# Patient Record
Sex: Female | Born: 1965 | Race: White | Hispanic: No | Marital: Married | Smoking: Never smoker
Health system: Southern US, Community
[De-identification: ages and names within clinical notes are randomized; demographics above are authoritative.]

## PROBLEM LIST (undated history)

## (undated) DIAGNOSIS — I10 Essential (primary) hypertension: Secondary | ICD-10-CM

## (undated) DIAGNOSIS — E785 Hyperlipidemia, unspecified: Secondary | ICD-10-CM

## (undated) DIAGNOSIS — C4492 Squamous cell carcinoma of skin, unspecified: Secondary | ICD-10-CM

## (undated) DIAGNOSIS — G43909 Migraine, unspecified, not intractable, without status migrainosus: Secondary | ICD-10-CM

## (undated) HISTORY — DX: Squamous cell carcinoma of skin, unspecified: C44.92

## (undated) HISTORY — DX: Essential (primary) hypertension: I10

## (undated) HISTORY — DX: Migraine, unspecified, not intractable, without status migrainosus: G43.909

## (undated) HISTORY — DX: Hyperlipidemia, unspecified: E78.5

---

## 1989-01-07 HISTORY — PX: TUBAL LIGATION: SHX77

## 2004-01-08 HISTORY — PX: CARPAL TUNNEL RELEASE: SHX101

## 2007-01-08 DIAGNOSIS — E785 Hyperlipidemia, unspecified: Secondary | ICD-10-CM

## 2007-01-08 DIAGNOSIS — I10 Essential (primary) hypertension: Secondary | ICD-10-CM

## 2007-01-08 HISTORY — DX: Essential (primary) hypertension: I10

## 2007-01-08 HISTORY — DX: Hyperlipidemia, unspecified: E78.5

## 2009-02-23 ENCOUNTER — Ambulatory Visit (HOSPITAL_COMMUNITY): Admission: RE | Admit: 2009-02-23 | Discharge: 2009-02-23 | Payer: Self-pay | Admitting: Family Medicine

## 2009-04-20 ENCOUNTER — Other Ambulatory Visit: Admission: RE | Admit: 2009-04-20 | Discharge: 2009-04-20 | Payer: Self-pay | Admitting: Family Medicine

## 2009-04-25 ENCOUNTER — Encounter: Admission: RE | Admit: 2009-04-25 | Discharge: 2009-04-25 | Payer: Self-pay | Admitting: Family Medicine

## 2009-06-15 ENCOUNTER — Ambulatory Visit (HOSPITAL_COMMUNITY): Admission: RE | Admit: 2009-06-15 | Discharge: 2009-06-15 | Payer: Self-pay | Admitting: Surgery

## 2010-03-26 LAB — CBC
Hemoglobin: 14 g/dL (ref 12.0–15.0)
MCHC: 35.2 g/dL (ref 30.0–36.0)
MCV: 92.6 fL (ref 78.0–100.0)
RBC: 4.29 MIL/uL (ref 3.87–5.11)
RDW: 13.2 % (ref 11.5–15.5)

## 2010-03-26 LAB — BASIC METABOLIC PANEL
Calcium: 9.9 mg/dL (ref 8.4–10.5)
Creatinine, Ser: 0.73 mg/dL (ref 0.4–1.2)
GFR calc Af Amer: >60 mL/min (ref >60)
Potassium: 4.8 mEq/L (ref 3.5–5.1)
Sodium: 138 mEq/L (ref 135–145)

## 2010-04-05 ENCOUNTER — Other Ambulatory Visit: Payer: Self-pay | Admitting: Family Medicine

## 2010-04-05 DIAGNOSIS — Z1231 Encounter for screening mammogram for malignant neoplasm of breast: Secondary | ICD-10-CM

## 2010-04-27 ENCOUNTER — Ambulatory Visit
Admission: RE | Admit: 2010-04-27 | Discharge: 2010-04-27 | Disposition: A | Discharge status: Home / Self Care | Payer: BC Managed Care – PPO | Source: Ambulatory Visit | Attending: Family Medicine | Admitting: Family Medicine

## 2010-04-27 DIAGNOSIS — Z1231 Encounter for screening mammogram for malignant neoplasm of breast: Secondary | ICD-10-CM

## 2010-09-24 ENCOUNTER — Other Ambulatory Visit: Payer: Self-pay | Admitting: Obstetrics and Gynecology

## 2010-09-24 ENCOUNTER — Other Ambulatory Visit (HOSPITAL_COMMUNITY)
Admission: RE | Admit: 2010-09-24 | Discharge: 2010-09-24 | Disposition: A | Discharge status: Home / Self Care | Payer: BC Managed Care – PPO | Source: Ambulatory Visit | Attending: Obstetrics and Gynecology | Admitting: Obstetrics and Gynecology

## 2010-09-24 DIAGNOSIS — Z01419 Encounter for gynecological examination (general) (routine) without abnormal findings: Secondary | ICD-10-CM | POA: Insufficient documentation

## 2011-03-22 ENCOUNTER — Other Ambulatory Visit: Payer: Self-pay | Admitting: Family Medicine

## 2011-03-22 DIAGNOSIS — Z1231 Encounter for screening mammogram for malignant neoplasm of breast: Secondary | ICD-10-CM

## 2011-04-29 ENCOUNTER — Ambulatory Visit
Admission: RE | Admit: 2011-04-29 | Discharge: 2011-04-29 | Disposition: A | Discharge status: Home / Self Care | Payer: BC Managed Care – PPO | Source: Ambulatory Visit | Attending: Family Medicine | Admitting: Family Medicine

## 2011-04-29 DIAGNOSIS — Z1231 Encounter for screening mammogram for malignant neoplasm of breast: Secondary | ICD-10-CM

## 2011-09-25 ENCOUNTER — Other Ambulatory Visit: Payer: Self-pay | Admitting: Obstetrics and Gynecology

## 2011-09-25 ENCOUNTER — Other Ambulatory Visit (HOSPITAL_COMMUNITY)
Admission: RE | Admit: 2011-09-25 | Discharge: 2011-09-25 | Disposition: A | Discharge status: Home / Self Care | Payer: BC Managed Care – PPO | Source: Ambulatory Visit | Attending: Obstetrics and Gynecology | Admitting: Obstetrics and Gynecology

## 2011-09-25 DIAGNOSIS — Z01419 Encounter for gynecological examination (general) (routine) without abnormal findings: Secondary | ICD-10-CM | POA: Insufficient documentation

## 2011-09-25 DIAGNOSIS — Z1151 Encounter for screening for human papillomavirus (HPV): Secondary | ICD-10-CM | POA: Insufficient documentation

## 2011-12-03 ENCOUNTER — Encounter: Payer: Self-pay | Admitting: Internal Medicine

## 2012-03-02 ENCOUNTER — Ambulatory Visit (INDEPENDENT_AMBULATORY_CARE_PROVIDER_SITE_OTHER): Payer: BC Managed Care – PPO | Admitting: Internal Medicine

## 2012-03-02 ENCOUNTER — Encounter: Payer: Self-pay | Admitting: Internal Medicine

## 2012-03-02 VITALS — BP 118/74 | HR 66 | Temp 98.1°F | Wt 175.0 lb

## 2012-03-02 DIAGNOSIS — I10 Essential (primary) hypertension: Secondary | ICD-10-CM | POA: Insufficient documentation

## 2012-03-02 DIAGNOSIS — E785 Hyperlipidemia, unspecified: Secondary | ICD-10-CM

## 2012-03-02 NOTE — Assessment & Plan Note (Signed)
Well controlled without apparent side effects from the Toprol. Check labs, will RF as needed

## 2012-03-02 NOTE — Progress Notes (Signed)
  Subjective:    Patient ID: Cindy Mooney, female    DOB: 1965/02/24, 47 y.o.   MRN: [id number]  HPI New pt, here to get stablished, used to be seen at [facility] . Hypertension, good medication compliance, no apparent side effects. Hyperlipidemia, good medication compliance, last labs about 9 months ago. In general feeling well, has a very healthy lifestyle  Past Medical History  Diagnosis Date  . HTN (hypertension) 2009  . Hyperlipidemia 2009  . Migraines     weather related    Past Surgical History  Procedure Laterality Date  . Cesarean section      x1  . Carpal tunnel release Bilateral 2006  . Btl     History   Social History  . Marital Status: Married    Spouse Name: N/A    Number of Children: 2  . 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 Smoking status: Never Smoker   . Smokeless tobacco: Never Used  . Alcohol Use: Yes     Comment: socially   . Drug Use: No  . Sexually Active: Not on file   Other Topics Concern  . Not on file   Social History Narrative   Exercises regulalrly   Family History  Problem Relation Age of Onset  . Colon cancer Neg Hx   . Breast cancer      aunt , dx in her 20  . Prostate cancer Father   . CAD Mother     M had a MI , first in her mid 6s, heavy ETOH-Tobacco  . Diabetes Neg Hx   . Hyperlipidemia Mother   . Hypertension Mother     Review of Systems No chest pain, shortness or breath. No nausea, vomiting, diarrhea.     Objective:   Physical Exam General -- alert, well-developed    Neck --no thyromegaly  Lungs -- normal respiratory effort, no intercostal retractions, no accessory muscle use, and normal breath sounds.   Heart-- normal rate, regular rhythm, no murmur, and no gallop.   Neurologic-- alert & oriented X3 and strength normal in all extremities. Psych-- Cognition and judgment appear intact. Alert and cooperative with normal attention span and  concentration.  not anxious appearing and not depressed appearing.       Assessment & Plan:   Mammograms at the breast center of [facility]. Gynecologist, Dr. Dion Body  at [facility]

## 2012-03-02 NOTE — Assessment & Plan Note (Signed)
Good compliance of medication, no apparent side effects, has a healthy lifestyle. We'll get labs. She sees a cardiologist, Dr. Fayrene Fearing McGukin in [place]

## 2012-03-02 NOTE — Patient Instructions (Signed)
Please come back fasting: CMP, CBC--- dx HTN  FLP--- dx  hyperlipidemia

## 2012-03-04 ENCOUNTER — Other Ambulatory Visit (INDEPENDENT_AMBULATORY_CARE_PROVIDER_SITE_OTHER): Payer: BC Managed Care – PPO

## 2012-03-04 DIAGNOSIS — I10 Essential (primary) hypertension: Secondary | ICD-10-CM

## 2012-03-04 DIAGNOSIS — E785 Hyperlipidemia, unspecified: Secondary | ICD-10-CM

## 2012-03-04 LAB — LIPID PANEL
HDL: 75 mg/dL (ref >39.00)
LDL Cholesterol: 57 mg/dL (ref 0–99)
Total CHOL/HDL Ratio: 2
VLDL: 9.6 mg/dL (ref 0.0–40.0)

## 2012-03-04 LAB — CBC WITH DIFFERENTIAL/PLATELET
Basophils Relative: 1.3 % (ref 0.0–3.0)
Eosinophils Relative: 0.5 % (ref 0.0–5.0)
Lymphocytes Relative: 38.8 % (ref 12.0–46.0)
Lymphs Abs: 2 10*3/uL (ref 0.7–4.0)
MCV: 91.7 fl (ref 78.0–100.0)
Neutro Abs: 2.7 10*3/uL (ref 1.4–7.7)
Neutrophils Relative %: 51.5 % (ref 43.0–77.0)
Platelets: 263 10*3/uL (ref 150.0–400.0)
RBC: 4.24 Mil/uL (ref 3.87–5.11)
WBC: 5.2 10*3/uL (ref 4.5–10.5)

## 2012-03-04 LAB — COMPREHENSIVE METABOLIC PANEL
AST: 21 U/L (ref 0–37)
Albumin: 3.6 g/dL (ref 3.5–5.2)
Alkaline Phosphatase: 73 U/L (ref 39–117)
BUN: 8 mg/dL (ref 6–23)
Calcium: 9 mg/dL (ref 8.4–10.5)
Chloride: 104 mEq/L (ref 96–112)
Creatinine, Ser: 0.7 mg/dL (ref 0.4–1.2)
GFR: 88.77 mL/min (ref >60.00)
Total Protein: 6.2 g/dL (ref 6.0–8.3)

## 2012-03-06 ENCOUNTER — Encounter: Payer: Self-pay | Admitting: *Deleted

## 2012-04-02 ENCOUNTER — Other Ambulatory Visit: Payer: Self-pay

## 2012-04-02 DIAGNOSIS — Z1231 Encounter for screening mammogram for malignant neoplasm of breast: Secondary | ICD-10-CM

## 2012-04-30 ENCOUNTER — Ambulatory Visit
Admission: RE | Admit: 2012-04-30 | Discharge: 2012-04-30 | Disposition: A | Discharge status: Home / Self Care | Payer: BC Managed Care – PPO | Source: Ambulatory Visit

## 2012-04-30 DIAGNOSIS — Z1231 Encounter for screening mammogram for malignant neoplasm of breast: Secondary | ICD-10-CM

## 2012-06-08 ENCOUNTER — Telehealth: Payer: Self-pay | Admitting: General Practice

## 2012-06-08 NOTE — Telephone Encounter (Signed)
Pt called stating that her cardiologist has advised pt to have her PCP fill her Metoprolol. Pt was last seen on 03-02-2012 by PAZ. Please advise if ok. Pt uses [facility].

## 2012-06-09 MED ORDER — METOPROLOL TARTRATE 50 MG PO TABS: 50.0000 mg | ORAL_TABLET | Freq: Every day | ORAL | Status: DC

## 2012-06-09 NOTE — Telephone Encounter (Signed)
Med filled.  

## 2012-06-09 NOTE — Telephone Encounter (Signed)
Ok RF x 1 year 

## 2012-06-12 ENCOUNTER — Telehealth: Payer: Self-pay | Admitting: General Practice

## 2012-06-12 MED ORDER — METOPROLOL SUCCINATE ER 50 MG PO TB24: 50.0000 mg | ORAL_TABLET | Freq: Every day | ORAL | Status: DC

## 2012-06-12 NOTE — Telephone Encounter (Signed)
Rx faxed to pharmacy with copy of fax pharmacy sent.

## 2012-06-12 NOTE — Telephone Encounter (Signed)
She needs  to stay on the same medication, I enter the right prescription, it is ready to be send

## 2012-06-12 NOTE — Telephone Encounter (Signed)
Pt called stating that our office needed to contact [facility] in regards to her BP medications. Called [phone] and spoke with insurance. They state pt has been on metoprolol succinate ER 50mg  since 02-26-2011 and wanted to verify that we did not want pt on same medication. If a change is necessary please type in comments "clarify for previous Rx # [id number]"

## 2012-07-31 ENCOUNTER — Ambulatory Visit (INDEPENDENT_AMBULATORY_CARE_PROVIDER_SITE_OTHER): Payer: BC Managed Care – PPO | Admitting: Internal Medicine

## 2012-07-31 VITALS — BP 130/88 | HR 64 | Temp 98.2°F | Wt 174.8 lb

## 2012-07-31 DIAGNOSIS — R079 Chest pain, unspecified: Secondary | ICD-10-CM

## 2012-07-31 LAB — D-DIMER, QUANTITATIVE: D-Dimer, Quant: 0.27 ug/mL-FEU (ref 0.00–0.48)

## 2012-07-31 LAB — BASIC METABOLIC PANEL WITH GFR
Creat: 0.69 mg/dL (ref 0.50–1.10)
Glucose, Bld: 82 mg/dL (ref 70–99)
Potassium: 4.5 mEq/L (ref 3.5–5.3)
Sodium: 136 mEq/L (ref 135–145)

## 2012-07-31 NOTE — Assessment & Plan Note (Addendum)
Chest pain, atypical for ACS, She did take a prolonged car trip recently. Patient reports she had a completely normal stress test 01-2012 by her cardiologist. EKG today NSR Plan: Motrin as needed D-dimer, BMP ER if symptoms severe (even if the d-dimer is negative, she may need a CT)

## 2012-07-31 NOTE — Patient Instructions (Addendum)
Motrin 200 mg 2 tablets every 6 hours as needed for pain. Always take it with food because may cause gastritis and ulcers. If you notice nausea, stomach pain, change in the color of stools --->  Stop the medicine and let us know You are not improving in the next few days please call. If you have severe symptoms either call or go to the ER.

## 2012-07-31 NOTE — Progress Notes (Signed)
  Subjective:    Patient ID: Cindy Mooney, female    DOB: 1965-09-30, 47 y.o.   MRN: [id number]  HPI Acute visit 3 days history of on and off chest pain, located anteriorly, on and off, described as ache, Some radiation to the back, intensity is 6/10; now is mild, constant dull ache worse with certain torso movements. She did drive to [facility] on [phone] and came back 7-20  Past Medical History  Diagnosis Date  . HTN (hypertension) 2009  . Hyperlipidemia 2009  . Migraines     weather related    Past Surgical History  Procedure Laterality Date  . Cesarean section      x1  . Carpal tunnel release Bilateral 2006  . Btl     History   Social History  . Marital Status: Married    Spouse Name: N/A    Number of Children: 2  . 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 Smoking status: Never Smoker   . Smokeless tobacco: Never Used  . Alcohol Use: Yes     Comment: socially   . Drug Use: No  . Sexually Active: Not on file   Other Topics Concern  . Not on file   Social History Narrative   Exercises regulalrly    Review of Systems No fever or chills. No cough or shortness or breath Denies any leg swelling or pain. She went to [facility] on vacation, admits she drank slightly more than usual. Also reports ++ stress at work. Denies classic heartburn.     Objective:   Physical Exam  Abdominal:     BP 130/88  Pulse 64  Temp(Src) 98.2 F (36.8 C) (Oral)  Wt 174 lb 12.8 oz (79.289 kg)  SpO2 95%  General -- alert, well-developed, NAD .    Lungs -- normal respiratory effort, no intercostal retractions, no accessory muscle use, and normal breath sounds.   Heart-- normal rate, regular rhythm, no murmur, and no gallop.   Abdomen--soft, non-tender, no distention, no masses .   Extremities-- no pretibial edema bilaterally; calves symmetric and not tender.  Neurologic-- alert & oriented X3 and strength  normal in all extremities. Psych-- Cognition and judgment appear intact. Alert and cooperative with normal attention span and concentration.  not anxious appearing and not depressed appearing.        Assessment & Plan:

## 2012-08-02 ENCOUNTER — Encounter: Payer: Self-pay | Admitting: Internal Medicine

## 2012-08-31 ENCOUNTER — Ambulatory Visit (INDEPENDENT_AMBULATORY_CARE_PROVIDER_SITE_OTHER): Payer: BC Managed Care – PPO | Admitting: Nurse Practitioner

## 2012-08-31 ENCOUNTER — Encounter: Payer: Self-pay | Admitting: Nurse Practitioner

## 2012-08-31 VITALS — BP 134/88 | HR 55 | Temp 97.9°F | Wt 175.8 lb

## 2012-08-31 DIAGNOSIS — E785 Hyperlipidemia, unspecified: Secondary | ICD-10-CM

## 2012-08-31 DIAGNOSIS — Z130 Encounter for screening for diseases of the blood and blood-forming organs and certain disorders involving the immune mechanism: Secondary | ICD-10-CM

## 2012-08-31 DIAGNOSIS — Z1321 Encounter for screening for nutritional disorder: Secondary | ICD-10-CM

## 2012-08-31 DIAGNOSIS — I10 Essential (primary) hypertension: Secondary | ICD-10-CM

## 2012-08-31 LAB — LIPID PANEL
Cholesterol: 149 mg/dL (ref 0–200)
HDL: 72.7 mg/dL (ref >39.00)
LDL Cholesterol: 53 mg/dL (ref 0–99)
Total CHOL/HDL Ratio: 2
Triglycerides: 118 mg/dL (ref 0.0–149.0)
VLDL: 23.6 mg/dL (ref 0.0–40.0)

## 2012-08-31 LAB — ALT: ALT: 24 U/L (ref 0–35)

## 2012-08-31 NOTE — Progress Notes (Signed)
Subjective:    Cindy Mooney is here for follow up of elevated cholesterol. Compliance with treatment has been good. She has not been on Paleo diet this summer, and has consumed more sugar than usual. The patient exercises frequently. Patient denies muscle pain associated with her medications.  The following portions of the patient's history were reviewed and updated as appropriate: allergies, current medications, past family history, past medical history and problem list.  Review of Systems Constitutional: negative for anorexia, fatigue, fevers, night sweats and weight loss Respiratory: negative for cough, pleurisy/chest pain and sputum Cardiovascular: negative for chest pain, chest pressure/discomfort, irregular heart beat and lower extremity edema Gastrointestinal: negative for abdominal pain, change in bowel habits and nausea    Objective:    BP 134/88  Pulse 55  Temp(Src) 97.9 F (36.6 C) (Oral)  Wt 175 lb 12.8 oz (79.742 kg)  SpO2 98%  LMP 08/17/2012 General appearance: alert, cooperative, appears stated age and no distress Head: Normocephalic, without obvious abnormality, atraumatic Eyes: negative findings: lids and lashes normal, conjunctivae and sclerae normal and corneas clear Lungs: clear to auscultation bilaterally Heart: regular rate and rhythm, S1, S2 normal, no murmur, click, rub or gallop Extremities: extremities normal, atraumatic, no cyanosis or edema Pulses: 2+ and symmetric  Lab Review Lab Results  Component Value Date   CHOL 149 08/31/2012   CHOL 142 03/04/2012   HDL 72.70 08/31/2012   HDL 65.78 03/04/2012      Assessment:    Dyslipidemia under excellent control, current meds lipitor 40 mg daily.   Vitamin D screen Plan:    1. Continue dietary measures ( decrease/eliminate processed flour & sugar), Continue regular exercise. Consider decreasing daily dose to 20 mg.  2 Vit d level today. Follow up in 6 months.

## 2012-08-31 NOTE — Patient Instructions (Signed)
This office will call you with lab results. Your medicines will be filled once labs are back. If you continue on statin medicines, I recommend you take CoQ10 supplement- 1T daily. Take blood pressure at home. If it is consistently over 140/90, please let Dr. Drue Novel know, as he may want to adjust your blood pressure medicine. Pleasure to meet you!

## 2012-09-22 ENCOUNTER — Encounter: Payer: Self-pay | Admitting: Physician Assistant

## 2012-09-22 ENCOUNTER — Ambulatory Visit (INDEPENDENT_AMBULATORY_CARE_PROVIDER_SITE_OTHER): Payer: BC Managed Care – PPO | Admitting: Physician Assistant

## 2012-09-22 VITALS — BP 118/88 | HR 72 | Temp 98.9°F | Resp 12 | Wt 179.0 lb

## 2012-09-22 DIAGNOSIS — R42 Dizziness and giddiness: Secondary | ICD-10-CM

## 2012-09-22 MED ORDER — MECLIZINE HCL 50 MG PO TABS: 25.0000 mg | ORAL_TABLET | Freq: Three times a day (TID) | ORAL | Status: DC | PRN

## 2012-09-22 NOTE — Progress Notes (Signed)
Patient ID: Cindy Mooney, female   DOB: 1965/06/02, 47 y.o.   MRN: [id number]  Patient presents to clinic today c/o intermittent dizziness over the past 3 days.  Patient also endorses sensation of clogged R ear.  Patient denies history of vertigo, menieres disease.  Denies nausea, vomiting.  Dizziness is worse when she turns her head quickly.  Denies lightheadedness or syncope.  Denies chest pain or heart palpitations.  Does endorse nasal congestion and pressure 2 days ago that has since resolved.  Denies ear pain, drainage, or change in hearing.  Past Medical History  Diagnosis Date  . HTN (hypertension) 2009  . Hyperlipidemia 2009  . Migraines     weather related     Current Outpatient Prescriptions on File Prior to Visit  Medication Sig Dispense Refill  . atorvastatin (LIPITOR) 80 MG tablet Take 40 mg by mouth daily.      . metoprolol succinate (TOPROL-XL) 50 MG 24 hr tablet Take 1 tablet (50 mg total) by mouth daily. Take with or immediately following a meal.  90 tablet  3   No current facility-administered medications on file prior to visit.    Allergies  Allergen Reactions  . Tetracyclines & Related     Hives and nausea    Family History  Problem Relation Age of Onset  . Colon cancer Neg Hx   . Breast cancer      aunt , dx in her 63  . Prostate cancer Father   . CAD Mother     M had a MI , first in her mid 77s, heavy ETOH-Tobacco  . Diabetes Neg Hx   . Hyperlipidemia Mother   . Hypertension Mother     History   Social History  . Marital Status: Married    Spouse Name: N/A    Number of Children: 2  . 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 Smoking status: Never Smoker   . Smokeless tobacco: Never Used  . Alcohol Use: Yes     Comment: socially   . Drug Use: No  . Sexual Activity: None   Other Topics Concern  . None   Social History Narrative   Exercises regulalrly   Review of Systems   Constitutional: Negative for fever, chills, weight loss and malaise/fatigue.  HENT: Positive for congestion. Negative for hearing loss, ear pain, sore throat, tinnitus and ear discharge.   Respiratory: Negative for cough.   Cardiovascular: Negative for chest pain and palpitations.  Neurological: Positive for dizziness. Negative for tingling, sensory change, focal weakness, seizures, loss of consciousness and headaches.  Endo/Heme/Allergies: Positive for environmental allergies.   Filed Vitals:   09/22/12 1534  BP: 118/88  Pulse: 72  Temp:   Resp:    Physical Exam  Vitals reviewed. Constitutional: She is oriented to person, place, and time and well-developed, well-nourished, and in no distress.  HENT:  Head: Normocephalic and atraumatic.  Right Ear: External ear normal.  Left Ear: External ear normal.  Nose: Nose normal.  Mouth/Throat: Oropharynx is clear and moist. No oropharyngeal exudate.  Eyes: Conjunctivae are normal. Pupils are equal, round, and reactive to light.  No nystagmus noted on examination  Neck: Neck supple.  Cardiovascular: Normal rate, regular rhythm, normal heart sounds and intact distal pulses.   Pulmonary/Chest: Effort normal and breath sounds normal.  Lymphadenopathy:    She has no cervical adenopathy.  Neurological: She is alert and  oriented to person, place, and time. No cranial nerve deficit.  Skin: Skin is warm and dry. No rash noted.    Recent Results (from the past 2160 hour(s))  BASIC METABOLIC PANEL WITH GFR     Status: None   Collection Time    07/31/12 12:36 PM      Result Value Range   Sodium 136  135 - 145 mEq/L   Potassium 4.5  3.5 - 5.3 mEq/L   Chloride 102  96 - 112 mEq/L   CO2 25  19 - 32 mEq/L   Glucose, Bld 82  70 - 99 mg/dL   BUN 18  6 - 23 mg/dL   Creat 0.86  5.78 - 4.69 mg/dL   Calcium 9.1  8.4 - 62.9 mg/dL   GFR, Est African American >89     GFR, Est Non African American >89     Comment:       The estimated GFR is a  calculation valid for adults (>=32 years old)     that uses the CKD-EPI algorithm to adjust for age and sex. It is       not to be used for children, pregnant women, hospitalized patients,        patients on dialysis, or with rapidly changing kidney function.     According to the NKDEP, eGFR >89 is normal, 60-89 shows mild     impairment, 30-59 shows moderate impairment, 15-29 shows severe     impairment and <15 is ESRD.        D-DIMER, QUANTITATIVE     Status: None   Collection Time    07/31/12 12:36 PM      Result Value Range   D-Dimer, Quant 0.27  0.00 - 0.48 ug/mL-FEU   Comment: At the inhouse established cutoff value of 0.48 ug/mL FEU, this     methology has been documented in the literature to have a sensitivity     and negative predictive value of at least 98-99%.  The test result     should be correlated with an assessment of the clinical probability of     DVT/VTE.  ALT     Status: None   Collection Time    08/31/12  9:14 AM      Result Value Range   ALT 24  0 - 35 U/L  AST     Status: None   Collection Time    08/31/12  9:14 AM      Result Value Range   AST 25  0 - 37 U/L  LIPID PANEL     Status: None   Collection Time    08/31/12  9:14 AM      Result Value Range   Cholesterol 149  0 - 200 mg/dL   Comment: ATP III Classification       Desirable:  < 200 mg/dL               Borderline High:  200 - 239 mg/dL          High:  > = 528 mg/dL   Triglycerides 413.2  0.0 - 149.0 mg/dL   Comment: Normal:  <440 mg/dLBorderline High:  150 - 199 mg/dL   HDL 10.27  >25.36 mg/dL   VLDL 64.4  0.0 - 03.4 mg/dL   LDL Cholesterol 53  0 - 99 mg/dL   Total CHOL/HDL Ratio 2     Comment:  Men          Women1/2 Average Risk     3.4          3.3Average Risk          5.0          4.42X Average Risk          9.6          7.13X Average Risk          15.0          11.0                      VITAMIN D 25 HYDROXY     Status: None   Collection Time    08/31/12  9:14 AM      Result  Value Range   Vit D, 25-Hydroxy 55  30 - 89 ng/mL   Comment: This assay accurately quantifies Vitamin D, which is the sum of the     25-Hydroxy forms of Vitamin D2 and D3.  Studies have shown that the     optimum concentration of 25-Hydroxy Vitamin D is 30 ng/mL or higher.      Concentrations of Vitamin D between 20 and 29 ng/mL are considered to     be insufficient and concentrations less than 20 ng/mL are considered     to be deficient for Vitamin D.    Assessment/Plan: No problem-specific assessment & plan notes found for this encounter.

## 2012-09-22 NOTE — Patient Instructions (Signed)
Please take a daily antihistamine -- Claritin or Zyrtec.  Take Meclizine as directed for dizziness.  Monitor salt intake.  Drink adequate fluids throughout the day.  Follow-up in 1-2 weeks.  If symptoms persist we may need a referral to ENT for further evaluation.  Dizziness Dizziness is a common problem. It is a feeling of unsteadiness or lightheadedness. You may feel like you are about to faint. Dizziness can lead to injury if you stumble or fall. A person of any age group can suffer from dizziness, but dizziness is more common in older adults. CAUSES  Dizziness can be caused by many different things, including:  Middle ear problems.  Standing for too long.  Infections.  An allergic reaction.  Aging.  An emotional response to something, such as the sight of blood.  Side effects of medicines.  Fatigue.  Problems with circulation or blood pressure.  Excess use of alcohol, medicines, or illegal drug use.  Breathing too fast (hyperventilation).  An arrhythmia or problems with your heart rhythm.  Low red blood cell count (anemia).  Pregnancy.  Vomiting, diarrhea, fever, or other illnesses that cause dehydration.  Diseases or conditions such as Parkinson's disease, high blood pressure (hypertension), diabetes, and thyroid problems.  Exposure to extreme heat. DIAGNOSIS  To find the cause of your dizziness, your caregiver may do a physical exam, lab tests, radiologic imaging scans, or an electrocardiography test (ECG).  TREATMENT  Treatment of dizziness depends on the cause of your symptoms and can vary greatly. HOME CARE INSTRUCTIONS   Drink enough fluids to keep your urine clear or pale yellow. This is especially important in very hot weather. In the elderly, it is also important in cold weather.  If your dizziness is caused by medicines, take them exactly as directed. When taking blood pressure medicines, it is especially important to get up slowly.  Rise slowly from  chairs and steady yourself until you feel okay.  In the morning, first sit up on the side of the bed. When this seems okay, stand slowly while holding onto something until you know your balance is fine.  If you need to stand in one place for a long time, be sure to move your legs often. Tighten and relax the muscles in your legs while standing.  If dizziness continues to be a problem, have someone stay with you for a day or two. Do this until you feel you are well enough to stay alone. Have the person call your caregiver if he or she notices changes in you that are concerning.  Do not drive or use heavy machinery if you feel dizzy.  Do not drink alcohol. SEEK IMMEDIATE MEDICAL CARE IF:   Your dizziness or lightheadedness gets worse.  You feel nauseous or vomit.  You develop problems with talking, walking, weakness, or using your arms, hands, or legs.  You are not thinking clearly or you have difficulty forming sentences. It may take a friend or family member to determine if your thinking is normal.  You develop chest pain, abdominal pain, shortness of breath, or sweating.  Your vision changes.  You notice any bleeding.  You have side effects from medicine that seems to be getting worse rather than better. MAKE SURE YOU:   Understand these instructions.  Will watch your condition.  Will get help right away if you are not doing well or get worse. Document Released: 06/19/2000 Document Revised: 03/18/2011 Document Reviewed: 07/13/2010 [facility] Patient Information 2014 [place], [place].

## 2012-09-22 NOTE — Assessment & Plan Note (Signed)
Rx Meclizine. Daily zyrtec.  Stay well hydrated.  Monitor salt intake.  If symptoms persist, return to clinic.

## 2012-09-24 ENCOUNTER — Telehealth: Payer: Self-pay | Admitting: Internal Medicine

## 2012-09-24 ENCOUNTER — Ambulatory Visit (HOSPITAL_BASED_OUTPATIENT_CLINIC_OR_DEPARTMENT_OTHER)
Admission: RE | Admit: 2012-09-24 | Discharge: 2012-09-24 | Disposition: A | Discharge status: Home / Self Care | Payer: BC Managed Care – PPO | Source: Ambulatory Visit | Attending: Physician Assistant | Admitting: Physician Assistant

## 2012-09-24 ENCOUNTER — Encounter: Payer: Self-pay | Admitting: Physician Assistant

## 2012-09-24 ENCOUNTER — Ambulatory Visit (INDEPENDENT_AMBULATORY_CARE_PROVIDER_SITE_OTHER): Payer: BC Managed Care – PPO | Admitting: Physician Assistant

## 2012-09-24 VITALS — BP 112/86 | HR 64 | Temp 97.7°F | Resp 16 | Ht 64.75 in | Wt 177.5 lb

## 2012-09-24 DIAGNOSIS — R42 Dizziness and giddiness: Secondary | ICD-10-CM

## 2012-09-24 MED ORDER — PROMETHAZINE HCL 12.5 MG PO TABS: 12.5000 mg | ORAL_TABLET | Freq: Four times a day (QID) | ORAL | Status: DC | PRN

## 2012-09-24 MED ORDER — PREDNISONE 20 MG PO TABS: ORAL_TABLET | ORAL | Status: DC

## 2012-09-24 NOTE — Telephone Encounter (Signed)
Patient Information:  Caller Name: [name]  Phone: [phone]  Patient: Cindy Mooney, Cindy Mooney  Gender: Female  DOB: 03-30-1965  Age: 47 Years  PCP: Willow Ora  Pregnant: No  Office Follow Up:  Does the office need to follow up with this patient?: No  Instructions For The Office: N/A  RN Note:  Patient reports she would like to be seen again today due to symptoms worsening. She saw Piedad Climes, PA in [facility] location on Tuesday. She would like to see if again this morning if he is available. Scheduled appointment in [facility] location. Advised patient to call back prior to appointment if any current symptom worsens or any new symptoms develop. Verbalized understanding.  Symptoms  Reason For Call & Symptoms: Has seen PA in [facility] office on Tuesday, 09/22/12. Was given Meclizine for dizziness. Patient reports symptoms are worsening instead of improving. Reports dizziness, nausea, pressure behind right eye.  Reviewed Health History In EMR: Yes  Reviewed Medications In EMR: Yes  Reviewed Allergies In EMR: Yes  Reviewed Surgeries / Procedures: Yes  Date of Onset of Symptoms: 09/20/2012  Treatments Tried: Meclizine  Treatments Tried Worked: No OB / GYN:  LMP: 09/10/2012  Guideline(s) Used:  Dizziness  Disposition Per Guideline:   Go to Office Now  Reason For Disposition Reached:   Lightheadedness (dizziness) present now, after 2 hours of rest and fluids  Advice Given:  N/A  Patient Will Follow Care Advice:  YES  Appointment Scheduled:  09/24/2012 11:00:00 Appointment Scheduled Provider:  Piedad Climes, PA in [facility] office location

## 2012-09-24 NOTE — Patient Instructions (Signed)
I have set up a referral for you to ENT.  Continue Meclizine -- can take 2 tablets as needed.  I have called in prescription for prednisone burt -- 2 tablets a day for 5 days.  Also sent in prescription for promethazine.  Take as directed for nausea symptoms.  If symptoms acutely worsen before appointment with ENT, please proceed directly to the ED.

## 2012-09-24 NOTE — Assessment & Plan Note (Signed)
Increase Meclizine two 1 tablet TID as needed.  Rx Prednisone 40 mg x 5 days.  Rx. Promethazine.  CT Head W/O Contrast. Referral to ENT.

## 2012-09-24 NOTE — Progress Notes (Signed)
Patient ID: Cindy Mooney, female   DOB: September 10, 1965, 47 y.o.   MRN: [id number]  Patient presents to clinic today c/o continual symptoms of vertigo, becoming more constant since office visit 2 days ago.  Patient endorses some nausea and pressure behind her right eye.  States she is becoming more off-balance than before.  Has tried Meclizine with little relief of symptoms.  Denies chest pain, palpitations, shortness of breath.  Orthostatics obtained at last visit and were negative.  Vital signs are within normal limits.    Past Medical History  Diagnosis Date  . HTN (hypertension) 2009  . Hyperlipidemia 2009  . Migraines     weather related     Current Outpatient Prescriptions on File Prior to Visit  Medication Sig Dispense Refill  . atorvastatin (LIPITOR) 80 MG tablet Take 40 mg by mouth daily.      . meclizine (ANTIVERT) 50 MG tablet Take 0.5 tablets (25 mg total) by mouth 3 (three) times daily as needed for dizziness.  30 tablet  0  . metoprolol succinate (TOPROL-XL) 50 MG 24 hr tablet Take 1 tablet (50 mg total) by mouth daily. Take with or immediately following a meal.  90 tablet  3   No current facility-administered medications on file prior to visit.    Allergies  Allergen Reactions  . Tetracyclines & Related     Hives and nausea    Family History  Problem Relation Age of Onset  . Colon cancer Neg Hx   . Breast cancer      aunt , dx in her 79  . Prostate cancer Father   . CAD Mother     M had a MI , first in her mid 61s, heavy ETOH-Tobacco  . Diabetes Neg Hx   . Hyperlipidemia Mother   . Hypertension Mother     History   Social History  . Marital Status: Married    Spouse Name: N/A    Number of Children: 2  . 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 Smoking status: Never Smoker   . Smokeless tobacco: Never Used  . Alcohol Use: Yes     Comment: socially   . Drug Use: No  . Sexual Activity: None    Other Topics Concern  . None   Social History Narrative   Exercises regulalrly   ROS See HPI  Filed Vitals:   09/24/12 1107  BP: 112/86  Pulse: 64  Temp: 97.7 F (36.5 C)  Resp: 16   Physical Exam  Vitals reviewed. Constitutional: She is oriented to person, place, and time and well-developed, well-nourished, and in no distress.  HENT:  Head: Normocephalic and atraumatic.  Right Ear: External ear normal.  Left Ear: External ear normal.  Nose: Nose normal.  Mouth/Throat: Oropharynx is clear and moist. No oropharyngeal exudate.  TM WNL bilaterally  Eyes: Conjunctivae and EOM are normal. Pupils are equal, round, and reactive to light.  Neck: Neck supple.  Cardiovascular: Normal rate, regular rhythm and normal heart sounds.   Pulmonary/Chest: Effort normal and breath sounds normal. No respiratory distress. She has no wheezes. She has no rales. She exhibits no tenderness.  Lymphadenopathy:    She has no cervical adenopathy.  Neurological: She is alert and oriented to person, place, and time. No cranial nerve deficit.  Positive Romberg test.  Skin: Skin is warm and dry. No rash noted.   Recent Results (from  the past 2160 hour(s))  BASIC METABOLIC PANEL WITH GFR     Status: None   Collection Time    07/31/12 12:36 PM      Result Value Range   Sodium 136  135 - 145 mEq/L   Potassium 4.5  3.5 - 5.3 mEq/L   Chloride 102  96 - 112 mEq/L   CO2 25  19 - 32 mEq/L   Glucose, Bld 82  70 - 99 mg/dL   BUN 18  6 - 23 mg/dL   Creat 4.09  8.11 - 9.14 mg/dL   Calcium 9.1  8.4 - 78.2 mg/dL   GFR, Est African American >89     GFR, Est Non African American >89     Comment:       The estimated GFR is a calculation valid for adults (>=75 years old)     that uses the CKD-EPI algorithm to adjust for age and sex. It is       not to be used for children, pregnant women, hospitalized patients,        patients on dialysis, or with rapidly changing kidney function.     According to the  NKDEP, eGFR >89 is normal, 60-89 shows mild     impairment, 30-59 shows moderate impairment, 15-29 shows severe     impairment and <15 is ESRD.        D-DIMER, QUANTITATIVE     Status: None   Collection Time    07/31/12 12:36 PM      Result Value Range   D-Dimer, Quant 0.27  0.00 - 0.48 ug/mL-FEU   Comment: At the inhouse established cutoff value of 0.48 ug/mL FEU, this     methology has been documented in the literature to have a sensitivity     and negative predictive value of at least 98-99%.  The test result     should be correlated with an assessment of the clinical probability of     DVT/VTE.  ALT     Status: None   Collection Time    08/31/12  9:14 AM      Result Value Range   ALT 24  0 - 35 U/L  AST     Status: None   Collection Time    08/31/12  9:14 AM      Result Value Range   AST 25  0 - 37 U/L  LIPID PANEL     Status: None   Collection Time    08/31/12  9:14 AM      Result Value Range   Cholesterol 149  0 - 200 mg/dL   Comment: ATP III Classification       Desirable:  < 200 mg/dL               Borderline High:  200 - 239 mg/dL          High:  > = 956 mg/dL   Triglycerides 213.0  0.0 - 149.0 mg/dL   Comment: Normal:  <865 mg/dLBorderline High:  150 - 199 mg/dL   HDL 78.46  >96.29 mg/dL   VLDL 52.8  0.0 - 41.3 mg/dL   LDL Cholesterol 53  0 - 99 mg/dL   Total CHOL/HDL Ratio 2     Comment:                Men          Women1/2 Average Risk     3.4  3.3Average Risk          5.0          4.42X Average Risk          9.6          7.13X Average Risk          15.0          11.0                      VITAMIN D 25 HYDROXY     Status: None   Collection Time    08/31/12  9:14 AM      Result Value Range   Vit D, 25-Hydroxy 55  30 - 89 ng/mL   Comment: This assay accurately quantifies Vitamin D, which is the sum of the     25-Hydroxy forms of Vitamin D2 and D3.  Studies have shown that the     optimum concentration of 25-Hydroxy Vitamin D is 30 ng/mL or higher.       Concentrations of Vitamin D between 20 and 29 ng/mL are considered to     be insufficient and concentrations less than 20 ng/mL are considered     to be deficient for Vitamin D.    Assessment/Plan: Vertigo Increase Meclizine two 1 tablet TID as needed.  Rx Prednisone 40 mg x 5 days.  Rx. Promethazine.  CT Head W/O Contrast. Referral to ENT.

## 2012-11-12 ENCOUNTER — Other Ambulatory Visit: Payer: Self-pay

## 2013-03-29 ENCOUNTER — Other Ambulatory Visit: Payer: Self-pay

## 2013-03-29 DIAGNOSIS — Z1231 Encounter for screening mammogram for malignant neoplasm of breast: Secondary | ICD-10-CM

## 2013-04-19 ENCOUNTER — Other Ambulatory Visit: Payer: Self-pay | Admitting: Internal Medicine

## 2013-05-03 ENCOUNTER — Ambulatory Visit
Admission: RE | Admit: 2013-05-03 | Discharge: 2013-05-03 | Disposition: A | Discharge status: Home / Self Care | Payer: Self-pay | Source: Ambulatory Visit

## 2013-05-03 DIAGNOSIS — Z1231 Encounter for screening mammogram for malignant neoplasm of breast: Secondary | ICD-10-CM

## 2013-05-05 ENCOUNTER — Telehealth: Payer: Self-pay | Admitting: Internal Medicine

## 2013-05-05 NOTE — Telephone Encounter (Signed)
A user error has taken place.

## 2013-05-12 ENCOUNTER — Ambulatory Visit (INDEPENDENT_AMBULATORY_CARE_PROVIDER_SITE_OTHER): Payer: BC Managed Care – PPO | Admitting: Internal Medicine

## 2013-05-12 ENCOUNTER — Encounter: Payer: Self-pay | Admitting: Internal Medicine

## 2013-05-12 ENCOUNTER — Ambulatory Visit (HOSPITAL_BASED_OUTPATIENT_CLINIC_OR_DEPARTMENT_OTHER)
Admission: RE | Admit: 2013-05-12 | Discharge: 2013-05-12 | Disposition: A | Discharge status: Home / Self Care | Payer: BC Managed Care – PPO | Source: Ambulatory Visit | Attending: Internal Medicine | Admitting: Internal Medicine

## 2013-05-12 VITALS — BP 127/85 | HR 67 | Temp 97.9°F | Wt 170.0 lb

## 2013-05-12 DIAGNOSIS — M7989 Other specified soft tissue disorders: Secondary | ICD-10-CM

## 2013-05-12 DIAGNOSIS — S99919A Unspecified injury of unspecified ankle, initial encounter: Secondary | ICD-10-CM

## 2013-05-12 DIAGNOSIS — S8990XA Unspecified injury of unspecified lower leg, initial encounter: Secondary | ICD-10-CM

## 2013-05-12 DIAGNOSIS — S99929A Unspecified injury of unspecified foot, initial encounter: Secondary | ICD-10-CM

## 2013-05-12 NOTE — Patient Instructions (Signed)
Talk with our scheduler about a ultrasound of the leg before you leave the office  Elevated the leg to help the swelling; motrin or Tylenol as needed if pain.  Call if the swelling is not improving within the next few days.  Schedule physical at your convenience.

## 2013-05-12 NOTE — Progress Notes (Signed)
   Subjective:    Patient ID: Cindy Mooney, female    DOB: [birth year], 48 y.o.   MRN: [id number]  DOS:  05/12/2013 Type of  visit: Acute visit Patient fell approximately 10 days ago, hit her left shin w/ a  furniture piece, had pain but no bleeding. 3 days later, she started to notice discoloration and swelling around the ankle. 2 days ago noted some swelling at the distal left lower extremity. Concerned about a clot.    ROS No chest pain or difficulty breathing No recent prolonged car trip or airplane trip. Left a slightly swollen? Denies pain at the ankle or calf.  Past Medical History  Diagnosis Date  . HTN (hypertension) 2009  . Hyperlipidemia 2009  . Migraines     weather related     Past Surgical History  Procedure Laterality Date  . Cesarean section      x1  . Carpal tunnel release Bilateral 2006  . Btl      History   Social History  . Marital Status: Married    Spouse Name: N/A    Number of Children: 2  . 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 Smoking status: Never Smoker   . Smokeless tobacco: Never Used  . Alcohol Use: Yes     Comment: socially   . Drug Use: No  . Sexual Activity: Not on file   Other Topics Concern  . Not on file   Social History Narrative   Exercises regulalrly        Medication List       This list is accurate as of: 05/12/13  6:31 PM.  Always use your most recent med list.               atorvastatin 80 MG tablet  Commonly known as:  LIPITOR  Take 40 mg by mouth daily.     metoprolol succinate 50 MG 24 hr tablet  Commonly known as:  TOPROL-XL  TAKE 1 BY MOUTH DAILY WITH OR IMMEDIATELY FOLLOWING A MEAL           Objective:   Physical Exam  Musculoskeletal:       Feet:   BP 127/85  Pulse 67  Temp(Src) 97.9 F (36.6 C)  Wt 170 lb (77.111 kg)  SpO2 94%  General -- alert, well-developed, NAD.    Extremities--  R leg normal L leg: Calf  not tender or swelling, + mild/trace swelling from mid pretibial area down ; area of injury slt tender w/o hematoma . L ankle slt swollen but FROM and no TTP. + echymosis-- see graphic Good pedal pulses B Good cap refill Neurologic--  alert & oriented X3. Speech normal, gait normal, strength normal in all extremities.  Psych-- Cognition and judgment appear intact. Cooperative with normal attention span and concentration. No anxious or depressed appearing.     Assessment & Plan:   shin injury, Patient presents after injury at the left shin, area is slightly tender, she has mild distal swelling, some ecchymoses. I think ecchymoses is related to the proximal injury, doubt ankle  injury per se. She is quite concerned about a clot, to be sure we agreed to do a  Ultrasound and r/o dvt. Otherwise recommend rest, Motrin

## 2013-05-12 NOTE — Progress Notes (Signed)
Pre visit review using our clinic review tool, if applicable. No additional management support is needed unless otherwise documented below in the visit note. 

## 2013-06-08 ENCOUNTER — Other Ambulatory Visit: Payer: Self-pay | Admitting: Internal Medicine

## 2013-06-08 DIAGNOSIS — I10 Essential (primary) hypertension: Secondary | ICD-10-CM

## 2013-06-09 NOTE — Telephone Encounter (Signed)
Refill for toprol sent to [facility]

## 2013-06-14 ENCOUNTER — Other Ambulatory Visit: Payer: Self-pay | Admitting: Internal Medicine

## 2013-07-26 ENCOUNTER — Ambulatory Visit (INDEPENDENT_AMBULATORY_CARE_PROVIDER_SITE_OTHER): Payer: BC Managed Care – PPO | Admitting: Internal Medicine

## 2013-07-26 ENCOUNTER — Encounter: Payer: Self-pay | Admitting: Internal Medicine

## 2013-07-26 VITALS — BP 123/83 | HR 68 | Temp 98.2°F | Ht 64.5 in | Wt 178.0 lb

## 2013-07-26 DIAGNOSIS — Z23 Encounter for immunization: Secondary | ICD-10-CM

## 2013-07-26 DIAGNOSIS — Z0000 Encounter for general adult medical examination without abnormal findings: Secondary | ICD-10-CM | POA: Insufficient documentation

## 2013-07-26 NOTE — Progress Notes (Signed)
Pre visit review using our clinic review tool, if applicable. No additional management support is needed unless otherwise documented below in the visit note. 

## 2013-07-26 NOTE — Assessment & Plan Note (Addendum)
Td -- today CCS, never had a cscope, different screening modalities discussed, elected cscope---refer to GI Female care per  [facility] dermatology regularly (pre cancerous) Sees cards d/t FH , recent coronary calcium score near zero per pt , they checked her cholestrol and it was very good (on meds and fish oil) Diet-exercise discussed RTC 1 year

## 2013-07-26 NOTE — Progress Notes (Signed)
   Subjective:    Patient ID: Cindy Mooney, female    DOB: April 22, 1965, 48 y.o.   MRN: [id number]  DOS:  07/26/2013 Type of visit - description: CPX History: feeling well      ROS Diet-- no the best Exercise-- still very active  No  CP, SOB Denies  nausea, vomiting diarrhea, blood in the stools (-) cough, sputum production (-) wheezing, chest congestion No dysuria, gross hematuria, difficulty urinating  No anxiety, depression    Past Medical History  Diagnosis Date  . HTN (hypertension) 2009  . Hyperlipidemia 2009  . Migraines     weather related     Past Surgical History  Procedure Laterality Date  . Cesarean section      x1  . Carpal tunnel release Bilateral 2006  . Btl      History   Social History  . Marital Status: Married    Spouse Name: N/A    Number of Children: 2  . 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 Smoking status: Never Smoker   . Smokeless tobacco: Never Used  . Alcohol Use: Yes     Comment: socially   . Drug Use: No  . Sexual Activity: Not on file   Other Topics Concern  . Not on file   Social History Narrative   Lives w/ husband, 2 children, 2 Gkids   Original from [place]      Family History  Problem Relation Age of Onset  . Colon cancer Neg Hx   . Breast cancer Other     aunt , dx in her 17  . Prostate cancer Father   . CAD Mother     M had a MI , first in her mid 44s, heavy ETOH-Tobacco  . Diabetes Neg Hx   . Hyperlipidemia Mother   . Hypertension Mother   . Lung cancer Brother        Medication List       This list is accurate as of: 07/26/13  5:49 PM.  Always use your most recent med list.               aspirin 81 MG tablet  Take 81 mg by mouth daily.     metoprolol succinate 50 MG 24 hr tablet  Commonly known as:  TOPROL-XL  TAKE 1 BY MOUTH DAILY WITH OR IMMEDIATELY FOLLOWING A MEAL     pravastatin 40 MG tablet  Commonly known as:   PRAVACHOL  Take 40 mg by mouth daily.           Objective:   Physical Exam BP 123/83  Pulse 68  Temp(Src) 98.2 F (36.8 C)  Ht 5' 4.5" (1.638 m)  Wt 178 lb (80.74 kg)  BMI 30.09 kg/m2  SpO2 97%  General -- alert, well-developed, NAD.  Neck --no thyromegaly , normal carotid pulse HEENT-- Not pale.  Lungs -- normal respiratory effort, no intercostal retractions, no accessory muscle use, and normal breath sounds.  Heart-- normal rate, regular rhythm, no murmur.  Abdomen-- Not distended, good bowel sounds,soft, non-tender.  extremities-- no pretibial edema bilaterally  Neurologic--  alert & oriented X3. Speech normal, gait appropriate for age, strength symmetric and appropriate for age.  Psych-- Cognition and judgment appear intact. Cooperative with normal attention span and concentration. No anxious or depressed appearing.     Assessment & Plan:

## 2013-07-26 NOTE — Patient Instructions (Signed)
Get your blood work before you leave   Next visit is for a physical exam in 1 year  No need to come back fasting Please make an appointment

## 2013-07-27 ENCOUNTER — Encounter: Payer: Self-pay | Admitting: Internal Medicine

## 2013-07-27 LAB — CBC WITH DIFFERENTIAL/PLATELET
BASOS PCT: 0.5 % (ref 0.0–3.0)
Basophils Absolute: 0 10*3/uL (ref 0.0–0.1)
EOS ABS: 0.1 10*3/uL (ref 0.0–0.7)
Eosinophils Relative: 0.7 % (ref 0.0–5.0)
HEMATOCRIT: 41 % (ref 36.0–46.0)
Hemoglobin: 14 g/dL (ref 12.0–15.0)
LYMPHS ABS: 2.4 10*3/uL (ref 0.7–4.0)
Lymphocytes Relative: 30.3 % (ref 12.0–46.0)
MCHC: 34.2 g/dL (ref 30.0–36.0)
MCV: 91.8 fl (ref 78.0–100.0)
MONO ABS: 0.4 10*3/uL (ref 0.1–1.0)
Monocytes Relative: 5.6 % (ref 3.0–12.0)
NEUTROS ABS: 4.9 10*3/uL (ref 1.4–7.7)
NEUTROS PCT: 62.9 % (ref 43.0–77.0)
Platelets: 269 10*3/uL (ref 150.0–400.0)
RBC: 4.46 Mil/uL (ref 3.87–5.11)
RDW: 13.3 % (ref 11.5–15.5)
WBC: 7.8 10*3/uL (ref 4.0–10.5)

## 2013-07-27 LAB — COMPREHENSIVE METABOLIC PANEL
ALBUMIN: 4 g/dL (ref 3.5–5.2)
ALK PHOS: 69 U/L (ref 39–117)
ALT: 18 U/L (ref 0–35)
AST: 25 U/L (ref 0–37)
BUN: 18 mg/dL (ref 6–23)
CO2: 22 mEq/L (ref 19–32)
Calcium: 9.4 mg/dL (ref 8.4–10.5)
Chloride: 100 mEq/L (ref 96–112)
Creatinine, Ser: 0.7 mg/dL (ref 0.4–1.2)
GFR: 88.26 mL/min (ref >60.00)
Glucose, Bld: 85 mg/dL (ref 70–99)
POTASSIUM: 4 meq/L (ref 3.5–5.1)
SODIUM: 134 meq/L — AB (ref 135–145)
TOTAL PROTEIN: 7.3 g/dL (ref 6.0–8.3)
Total Bilirubin: 1 mg/dL (ref 0.2–1.2)

## 2013-07-27 LAB — TSH: TSH: 1.3 u[IU]/mL (ref 0.35–4.50)

## 2013-09-07 ENCOUNTER — Other Ambulatory Visit: Payer: Self-pay | Admitting: Internal Medicine

## 2013-09-24 ENCOUNTER — Ambulatory Visit (AMBULATORY_SURGERY_CENTER): Payer: Self-pay | Admitting: *Deleted

## 2013-09-24 VITALS — Ht 64.5 in | Wt 177.2 lb

## 2013-09-24 DIAGNOSIS — Z1211 Encounter for screening for malignant neoplasm of colon: Secondary | ICD-10-CM

## 2013-09-24 MED ORDER — MOVIPREP 100 G PO SOLR: ORAL | Status: DC

## 2013-09-24 NOTE — Progress Notes (Signed)
No allergies to eggs or soy. No problems with anesthesia.  Pt given Emmi instructions for colonoscopy  No oxygen use  No diet drug use  

## 2013-10-01 ENCOUNTER — Encounter: Payer: Self-pay | Admitting: Internal Medicine

## 2013-10-08 ENCOUNTER — Ambulatory Visit (AMBULATORY_SURGERY_CENTER): Payer: BC Managed Care – PPO | Admitting: Internal Medicine

## 2013-10-08 ENCOUNTER — Encounter: Payer: Self-pay | Admitting: Internal Medicine

## 2013-10-08 VITALS — BP 147/61 | HR 51 | Temp 96.6°F | Resp 24 | Ht 64.0 in | Wt 177.0 lb

## 2013-10-08 DIAGNOSIS — D125 Benign neoplasm of sigmoid colon: Secondary | ICD-10-CM

## 2013-10-08 DIAGNOSIS — K635 Polyp of colon: Secondary | ICD-10-CM

## 2013-10-08 DIAGNOSIS — Z1211 Encounter for screening for malignant neoplasm of colon: Secondary | ICD-10-CM

## 2013-10-08 MED ORDER — SODIUM CHLORIDE 0.9 % IV SOLN: 500.0000 mL | INTRAVENOUS | Status: DC

## 2013-10-08 NOTE — Progress Notes (Signed)
Called to room to assist during endoscopic procedure.  Patient ID and intended procedure confirmed with present staff. Received instructions for my participation in the procedure from the performing physician.  

## 2013-10-08 NOTE — Patient Instructions (Addendum)
YOU HAD AN ENDOSCOPIC PROCEDURE TODAY AT THE [facility]: Refer to the procedure report that was given to you for any specific questions about what was found during the examination.  If the procedure report does not answer your questions, please call your gastroenterologist to clarify.  If you requested that your care partner not be given the details of your procedure findings, then the procedure report has been included in a sealed envelope for you to review at your convenience later.  YOU SHOULD EXPECT: Some feelings of bloating in the abdomen. Passage of more gas than usual.  Walking can help get rid of the air that was put into your GI tract during the procedure and reduce the bloating. If you had a lower endoscopy (such as a colonoscopy or flexible sigmoidoscopy) you may notice spotting of blood in your stool or on the toilet paper. If you underwent a bowel prep for your procedure, then you may not have a normal bowel movement for a few days.  DIET: Your first meal following the procedure should be a light meal and then it is ok to progress to your normal diet.  A half-sandwich or bowl of soup is an example of a good first meal.  Heavy or fried foods are harder to digest and may make you feel nauseous or bloated.  Likewise meals heavy in dairy and vegetables can cause extra gas to form and this can also increase the bloating.  Drink plenty of fluids but you should avoid alcoholic beverages for 24 hours.  ACTIVITY: Your care partner should take you home directly after the procedure.  You should plan to take it easy, moving slowly for the rest of the day.  You can resume normal activity the day after the procedure however you should NOT DRIVE or use heavy machinery for 24 hours (because of the sedation medicines used during the test).    SYMPTOMS TO REPORT IMMEDIATELY: A gastroenterologist can be reached at any hour.  During normal business hours, 8:30 AM to 5:00 PM Monday through Friday,  call [phone].  After hours and on weekends, please call the GI answering service at [phone] who will take a message and have the physician on call contact you.   Following lower endoscopy (colonoscopy or flexible sigmoidoscopy):  Excessive amounts of blood in the stool  Significant tenderness or worsening of abdominal pains  Swelling of the abdomen that is new, acute  Fever of 100F or higher    FOLLOW UP: If any biopsies were taken you will be contacted by phone or by letter within the next 1-3 weeks.  Call your gastroenterologist if you have not heard about the biopsies in 3 weeks.  Our staff will call the home number listed on your records the next business day following your procedure to check on you and address any questions or concerns that you may have at that time regarding the information given to you following your procedure. This is a courtesy call and so if there is no answer at the home number and we have not heard from you through the emergency physician on call, we will assume that you have returned to your regular daily activities without incident.  SIGNATURES/CONFIDENTIALITY: You and/or your care partner have signed paperwork which will be entered into your electronic medical record.  These signatures attest to the fact that that the information above on your After Visit Summary has been reviewed and is understood.  Full responsibility of the confidentiality   of this discharge information lies with you and/or your care-partner.  Polyp information given.  Dr. Henrene Pastor will advise you about repeat colonoscopy.

## 2013-10-08 NOTE — Op Note (Signed)
[place] [address] [place]  [address]. [place], [address]   COLONOSCOPY PROCEDURE REPORT  PATIENT: Cindy Mooney, Cindy Mooney  MR#: [id number] BIRTHDATE: [birth year] , 19  yrs. old GENDER: female ENDOSCOPIST: Geri [place]in[place], Docia Chuck REFERRED FH:QRFX Larose Kells, M.D. PROCEDURE DATE:  10/08/2013 PROCEDURE:   Colonoscopy with snare polypectomy x 1 First Screening Colonoscopy - Avg.  risk and is 50 yrs.  old or older Yes.  Prior Negative Screening - Now for repeat screening. N/A  History of Adenoma - Now for follow-up colonoscopy & has been > or = to 3 yrs.  N/A  Polyps Removed Today? Yes. ASA CLASS:   Class II INDICATIONS:average risk for colorectal cancer. MEDICATIONS: Monitored anesthesia care and Propofol 250 mg IV  DESCRIPTION OF PROCEDURE:   After the risks benefits and alternatives of the procedure were thoroughly explained, informed consent was obtained.  The digital rectal exam revealed no abnormalities of the rectum.   The LB [id number]  endoscope was introduced through the anus and advanced to the cecum, which was identified by both the appendix and ileocecal valve. No adverse events experienced.   The quality of the prep was excellent, using MoviPrep  The instrument was then slowly withdrawn as the colon was fully examined.     COLON FINDINGS: A single polyp measuring 3 mm in size was found in the sigmoid colon.  A polypectomy was performed with a cold snare. The resection was complete, the polyp tissue was completely retrieved and sent to histology.   The examination was otherwise normal.  Retroflexed views revealed no abnormalities. The time to cecum=1 minutes 47 seconds.  Withdrawal time=10 minutes 59 seconds. The scope was withdrawn and the procedure completed. COMPLICATIONS: There were no immediate complications.  ENDOSCOPIC IMPRESSION: 1.   Single polyp measuring 3 mm in size was found in the sigmoid colon; polypectomy was performed with a cold snare 2.   The examination  was otherwise normal  RECOMMENDATIONS: 1. Repeat colonoscopy in 5 years if polyp adenomatous; otherwise 10 years  eSigned:  Eustace Quail, MD 10/08/2013 8:43 AM   cc: Kathlene November, MD and The Patient

## 2013-10-08 NOTE — Progress Notes (Signed)
A/ox3 pleased with MAC, report to Jane RN 

## 2013-10-11 ENCOUNTER — Telehealth: Payer: Self-pay | Admitting: *Deleted

## 2013-10-11 NOTE — Telephone Encounter (Signed)
  Follow up Call-  Call back number 10/08/2013  Post procedure Call Back phone  # [phone]  Permission to leave phone message Yes     Patient questions:  Do you have a fever, pain , or abdominal swelling? No. Pain Score  0 *  Have you tolerated food without any problems? Yes.    Have you been able to return to your normal activities? Yes.    Do you have any questions about your discharge instructions: Diet   No. Medications  No. Follow up visit  No.  Do you have questions or concerns about your Care? No.  Actions: * If pain score is 4 or above: No action needed, pain <4.

## 2013-10-13 ENCOUNTER — Encounter: Payer: Self-pay | Admitting: Internal Medicine

## 2014-01-03 ENCOUNTER — Other Ambulatory Visit: Payer: Self-pay

## 2014-01-03 ENCOUNTER — Telehealth: Payer: Self-pay | Admitting: Internal Medicine

## 2014-01-03 MED ORDER — PRAVASTATIN SODIUM 40 MG PO TABS: 40.0000 mg | ORAL_TABLET | Freq: Every day | ORAL | Status: DC

## 2014-01-03 MED ORDER — METOPROLOL SUCCINATE ER 50 MG PO TB24: ORAL_TABLET | ORAL | Status: DC

## 2014-01-03 NOTE — Telephone Encounter (Signed)
Metoprolol and pravastatin sent to pharmacy, 15 day supply. I do not see where Pt is taking Vesicare, or in medication history. Please inform Pt meds have been sent, however, depending on when she received her last refills her insurance may not pay.

## 2014-01-03 NOTE — Telephone Encounter (Signed)
Informed patient of this.  °

## 2014-01-03 NOTE — Telephone Encounter (Signed)
Caller name: Aayla Relation to pt: self Call back number: [phone] Pharmacy:  Reason for call:   Patient is on vacation and forgot her medication at home. She would like an emergency supply sent to [facility] in [place], [place] on [address]   Metoprolol, pravastatin, vesicare.

## 2014-01-26 ENCOUNTER — Encounter: Payer: Self-pay | Admitting: Internal Medicine

## 2014-01-26 ENCOUNTER — Ambulatory Visit (INDEPENDENT_AMBULATORY_CARE_PROVIDER_SITE_OTHER): Payer: 59 | Admitting: Internal Medicine

## 2014-01-26 VITALS — BP 122/84 | HR 58 | Temp 98.4°F | Ht 64.5 in | Wt 179.4 lb

## 2014-01-26 DIAGNOSIS — N63 Unspecified lump in unspecified breast: Secondary | ICD-10-CM

## 2014-01-26 NOTE — Progress Notes (Signed)
Pre visit review using our clinic review tool, if applicable. No additional management support is needed unless otherwise documented below in the visit note. 

## 2014-01-26 NOTE — Progress Notes (Signed)
   Subjective:    Patient ID: Cindy Mooney, female    DOB: [birth year], 49 y.o.   MRN: [id number]  DOS:  01/26/2014 Type of visit - description : acute Interval history: 2 days ago she found a lump at the left breast, around 2:00 o'clock, this is  new, she has a old and unchanged lump at around 4:00 o'clock .    ROS Denies any breast pain, rash or nipple discharge. She is 49 years old, periods are normal. Saw her gynecologist approximately 3 months ago  Past Medical History  Diagnosis Date  . HTN (hypertension) 2009  . Hyperlipidemia 2009  . Migraines     weather related     Past Surgical History  Procedure Laterality Date  . Cesarean section  1990    x1  . Carpal tunnel release Bilateral 2006  . Tubal ligation  1991    History   Social History  . Marital Status: Married    Spouse Name: N/A    Number of Children: 2  . 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 Smoking status: Never Smoker   . Smokeless tobacco: Never Used  . Alcohol Use: 2.0 oz/week    4 drink(s) per week  . Drug Use: No  . Sexual Activity: Not on file   Other Topics Concern  . Not on file   Social History Narrative   Lives w/ husband, 2 children, 2 Gkids   Original from [place]         Medication List       This list is accurate as of: 01/26/14 11:59 PM.  Always use your most recent med list.               aspirin 81 MG tablet  Take 81 mg by mouth daily.     cyanocobalamin 100 MCG tablet  Take 100 mcg by mouth daily.     FISH OIL PO  Take by mouth daily.     metoprolol succinate 50 MG 24 hr tablet  Commonly known as:  TOPROL-XL  TAKE 1 BY MOUTH DAILY WITH OR IMMEDIATELY FOLLOWING A MEAL     pravastatin 40 MG tablet  Commonly known as:  PRAVACHOL  Take 1 tablet (40 mg total) by mouth daily.           Objective:   Physical Exam BP 122/84 mmHg  Pulse 58  Temp(Src) 98.4 F (36.9 C) (Oral)  Ht 5'  4.5" (1.638 m)  Wt 179 lb 6 oz (81.364 kg)  BMI 30.33 kg/m2  SpO2 95%  LMP 01/03/2014 General -- alert, well-developed, NAD.   Breasts: Right breast normal, no axillary LAD his. Left breast: Skin and nipples normal, at around 4:00 has a soft, round,, 34 cm mass that she reports has been there for a while. At  round 2:00, has a smaller, 21 cm, slightly harder, new lump, not attached to the skin or deeper structures. No axillary LADs Neurologic--  alert & oriented X3. Speech normal, gait appropriate for age, strength symmetric and appropriate for age.  Psych-- Cognition and judgment appear intact. Cooperative with normal attention span and concentration. No anxious or depressed appearing.        Assessment & Plan:

## 2014-01-26 NOTE — Patient Instructions (Signed)
Will schedule a MMG and a [place]

## 2014-01-28 DIAGNOSIS — N63 Unspecified lump in unspecified breast: Secondary | ICD-10-CM | POA: Insufficient documentation

## 2014-01-28 NOTE — Assessment & Plan Note (Addendum)
New lump, left breast. The patient has regular mammograms, last one was 04-2013, I don't have a report. Family history: Has aunt with breast cancer diagnosed in her 79s. No personal history of breast cancer or breast surgery. Plan: Mammogram and ultrasound, further advice w/ results

## 2014-02-02 ENCOUNTER — Ambulatory Visit
Admission: RE | Admit: 2014-02-02 | Discharge: 2014-02-02 | Disposition: A | Discharge status: Home / Self Care | Payer: 59 | Source: Ambulatory Visit | Attending: Internal Medicine | Admitting: Internal Medicine

## 2014-02-02 DIAGNOSIS — N63 Unspecified lump in unspecified breast: Secondary | ICD-10-CM

## 2014-02-07 ENCOUNTER — Other Ambulatory Visit: Payer: Self-pay

## 2014-02-07 MED ORDER — METOPROLOL SUCCINATE ER 50 MG PO TB24: ORAL_TABLET | ORAL | Status: DC

## 2014-07-12 ENCOUNTER — Telehealth: Payer: Self-pay | Admitting: Internal Medicine

## 2014-07-12 NOTE — Telephone Encounter (Signed)
pre visit letter mailed 07/07/14

## 2014-07-27 ENCOUNTER — Encounter: Payer: Self-pay | Admitting: Behavioral Health

## 2014-07-27 ENCOUNTER — Telehealth: Payer: Self-pay | Admitting: Behavioral Health

## 2014-07-27 NOTE — Telephone Encounter (Signed)
Pre-Visit Call completed with patient and chart updated.   Pre-Visit Info documented in Specialty Comments under [employer].    

## 2014-07-28 ENCOUNTER — Encounter: Payer: Self-pay | Admitting: Internal Medicine

## 2014-07-28 ENCOUNTER — Ambulatory Visit (INDEPENDENT_AMBULATORY_CARE_PROVIDER_SITE_OTHER): Payer: 59 | Admitting: Internal Medicine

## 2014-07-28 VITALS — BP 118/72 | HR 62 | Temp 97.9°F | Ht 65.0 in | Wt 181.1 lb

## 2014-07-28 DIAGNOSIS — Z0000 Encounter for general adult medical examination without abnormal findings: Secondary | ICD-10-CM

## 2014-07-28 LAB — LIPID PANEL
CHOL/HDL RATIO: 3
Cholesterol: 218 mg/dL — ABNORMAL HIGH (ref 0–200)
HDL: 75.1 mg/dL (ref >39.00)
LDL CALC: 104 mg/dL — AB (ref 0–99)
NonHDL: 142.9
TRIGLYCERIDES: 194 mg/dL — AB (ref 0.0–149.0)
VLDL: 38.8 mg/dL (ref 0.0–40.0)

## 2014-07-28 LAB — BASIC METABOLIC PANEL
BUN: 13 mg/dL (ref 6–23)
CHLORIDE: 106 meq/L (ref 96–112)
CO2: 24 mEq/L (ref 19–32)
CREATININE: 0.68 mg/dL (ref 0.40–1.20)
Calcium: 8.9 mg/dL (ref 8.4–10.5)
GFR: 96.92 mL/min (ref >60.00)
GLUCOSE: 89 mg/dL (ref 70–99)
POTASSIUM: 3.8 meq/L (ref 3.5–5.1)
Sodium: 138 mEq/L (ref 135–145)

## 2014-07-28 LAB — ALT: ALT: 14 U/L (ref 0–35)

## 2014-07-28 LAB — HIV ANTIBODY (ROUTINE TESTING W REFLEX): HIV: NONREACTIVE

## 2014-07-28 LAB — TSH: TSH: 1.98 u[IU]/mL (ref 0.35–4.50)

## 2014-07-28 LAB — AST: AST: 16 U/L (ref 0–37)

## 2014-07-28 MED ORDER — PRAVASTATIN SODIUM 40 MG PO TABS: 40.0000 mg | ORAL_TABLET | Freq: Every day | ORAL | Status: DC

## 2014-07-28 MED ORDER — METOPROLOL SUCCINATE ER 50 MG PO TB24: 50.0000 mg | ORAL_TABLET | Freq: Every day | ORAL | Status: DC

## 2014-07-28 NOTE — Progress Notes (Signed)
Pre visit review using our clinic review tool, if applicable. No additional management support is needed unless otherwise documented below in the visit note. 

## 2014-07-28 NOTE — Progress Notes (Signed)
Subjective:    Patient ID: Cindy Mooney, female    DOB: July 29, 1965, 49 y.o.   MRN: [id number]  DOS:  07/28/2014 Type of visit - description :  CPX Interval history: in general feeling well, no major concerns  Review of Systems Constitutional: No fever. No chills. Gained some weight, lately not as active, diet not the best. No unusual sweats  HEENT: No dental problems, no ear discharge, no facial swelling, no voice changes. No eye discharge, no eye  redness , no  intolerance to light   Respiratory: No wheezing , no  difficulty breathing. No cough , no mucus production  Cardiovascular: No CP, no leg swelling , no  Palpitations  GI: no nausea, no vomiting, no diarrhea , no  abdominal pain.  No blood in the stools. No dysphagia, no odynophagia    Endocrine: No polyphagia, no polyuria , no polydipsia  GU: No dysuria, gross hematuria, difficulty urinating. No urinary urgency, no frequency.  Musculoskeletal: Left ankle sprain? Mild swelling, no pain. No calf pain, symptoms started before she went to [place].  Skin: No change in the color of the skin, palor , no  Rash  Allergic, immunologic: No environmental allergies , no  food allergies  Neurological: No dizziness no  syncope. No headaches. No diplopia, no slurred, no slurred speech, no motor deficits, no facial  Numbness  Hematological: No enlarged lymph nodes, no easy bruising , no unusual bleedings  Psychiatry: No suicidal ideas, no hallucinations, no beavior problems, no confusion.  No unusual/severe anxiety, no depression   Past Medical History  Diagnosis Date  . HTN (hypertension) 2009  . Hyperlipidemia 2009  . Migraines     weather related     Past Surgical History  Procedure Laterality Date  . Cesarean section  1990    x1  . Carpal tunnel release Bilateral 2006  . Tubal ligation  1991    History   Social History  . Marital Status: Married    Spouse Name: N/A  . Number of Children: 2  . 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 Smoking status: Never Smoker   . Smokeless tobacco: Never Used  . Alcohol Use: 2.0 oz/week    4 drink(s) per week  . Drug Use: No  . Sexual Activity: Not on file   Other Topics Concern  . Not on file   Social History Narrative   Lives w/ husband, 2 children, 2 Gkids   Original from [place]      Family History  Problem Relation Age of Onset  . Colon cancer Neg Hx   . Breast cancer Other     aunt , dx in her 78  . Prostate cancer Father   . CAD Mother     M had a MI , first in her mid 55s, heavy ETOH-Tobacco  . Diabetes Neg Hx   . Hyperlipidemia Mother   . Hypertension Mother   . Lung cancer Brother        Medication List       This list is accurate as of: 07/28/14 11:59 PM.  Always use your most recent med list.               aspirin 81 MG tablet  Take 81 mg by mouth daily.     cyanocobalamin 100 MCG tablet  Take 100 mcg by mouth daily.     FISH  OIL PO  Take by mouth daily.     metoprolol succinate 50 MG 24 hr tablet  Commonly known as:  TOPROL-XL  Take 1 tablet (50 mg total) by mouth daily. Take with or immediately following a meal.     pravastatin 40 MG tablet  Commonly known as:  PRAVACHOL  Take 1 tablet (40 mg total) by mouth daily.     VESICARE PO  Take by mouth daily.           Objective:   Physical Exam BP 118/72 mmHg  Pulse 62  Temp(Src) 97.9 F (36.6 C) (Oral)  Ht 5\' 5"  (1.651 m)  Wt 181 lb 2 oz (82.158 kg)  BMI 30.14 kg/m2  SpO2 97%  LMP 07/23/2014 (Exact Date) General:   Well developed, well nourished . NAD.  HEENT:  Normocephalic . Face symmetric, atraumatic. No thyromegaly Lungs:  CTA B Normal respiratory effort, no intercostal retractions, no accessory muscle use. Heart: RRR,  no murmur.  no pretibial edema bilaterally . Canals symmetric MSK: No actual pitting edema around the left ankle , range of motion normal Abdomen:   Not distended, soft, non-tender. No rebound or rigidity. No mass,organomegaly Skin: Not pale. Not jaundice Neurologic:  alert & oriented X3.  Speech normal, gait appropriate for age and unassisted Psych--  Cognition and judgment appear intact.  Cooperative with normal attention span and concentration.  Behavior appropriate. No anxious or depressed appearing.      Assessment & Plan:

## 2014-07-28 NOTE — Assessment & Plan Note (Addendum)
Td --2015 CCS, had a cscope 10-2013 Female care per  Gyn mammogram and ultrasound wnl 01-2014 Sees dermatology regularly (pre cancerous) Sees cards d/t FH  Diet-exercise discussed High cholesterol, hypertension: Refill medications RTC 1 year

## 2014-07-28 NOTE — Patient Instructions (Signed)
Get your blood work before you leave    

## 2014-09-01 ENCOUNTER — Ambulatory Visit (INDEPENDENT_AMBULATORY_CARE_PROVIDER_SITE_OTHER): Payer: 59 | Admitting: Internal Medicine

## 2014-09-01 ENCOUNTER — Encounter: Payer: Self-pay | Admitting: Internal Medicine

## 2014-09-01 VITALS — BP 138/86 | HR 70 | Temp 97.7°F | Ht 65.0 in | Wt 174.5 lb

## 2014-09-01 DIAGNOSIS — I10 Essential (primary) hypertension: Secondary | ICD-10-CM

## 2014-09-01 NOTE — Progress Notes (Signed)
Pre visit review using our clinic review tool, if applicable. No additional management support is needed unless otherwise documented below in the visit note. 

## 2014-09-01 NOTE — Patient Instructions (Signed)
Increase metoprolol to 1.5 tabs a day    Check the  blood pressure 2 or 3 times a  week Be sure your blood pressure is between 110/65 and  145/85.  if it is consistently higher or lower, let me know

## 2014-09-01 NOTE — Progress Notes (Signed)
Subjective:    Patient ID: Cindy Mooney, female    DOB: [birth year], 49 y.o.   MRN: [id number]  DOS:  09/01/2014 Type of visit - description : Acute visit Interval history:  About 10 days ago started low-fat high-fiber diet along with hCG sublingual tabs to lose weight, has lost several pounds. BP was noted to be increase in the last few days, around 130/90, top reading 138/95. Her sodium intake has not increased and is actually moderate. Not taking any NSAIDs. Has not started exercise program yet.  Review of Systems  Denies chest pain or difficulty breathing. No lower extremity edema. Some increased stress, pt and husband  are building a house.  Past Medical History  Diagnosis Date  . HTN (hypertension) 2009  . Hyperlipidemia 2009  . Migraines     weather related     Past Surgical History  Procedure Laterality Date  . Cesarean section  1990    x1  . Carpal tunnel release Bilateral 2006  . Tubal ligation  1991    Social History   Social History  . Marital Status: Married    Spouse Name: N/A  . Number of Children: 2  . 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 Smoking status: Never Smoker   . Smokeless tobacco: Never Used  . Alcohol Use: 2.0 oz/week    4 drink(s) per week  . Drug Use: No  . Sexual Activity: Not on file   Other Topics Concern  . Not on file   Social History Narrative   Lives w/ husband, 2 children, 2 Gkids   Original from [place]         Medication List       This list is accurate as of: 09/01/14  1:26 PM.  Always use your most recent med list.               aspirin 81 MG tablet  Take 81 mg by mouth daily.     B-6 PO  Take 1 tablet by mouth daily.     FISH OIL PO  Take by mouth daily.     MAGNESIUM CITRATE PO  Take 1 tablet by mouth daily.     METHYLCOBALAMIN PO  Place 1 tablet under the tongue daily. With HCG     metoprolol succinate 50 MG 24 hr  tablet  Commonly known as:  TOPROL-XL  Take 75 mg by mouth daily. Take with or immediately following a meal.     pravastatin 40 MG tablet  Commonly known as:  PRAVACHOL  Take 1 tablet (40 mg total) by mouth daily.     PROBIOTIC PO  Take 1 tablet by mouth daily. Intestinal Formula     VESICARE PO  Take by mouth daily.           Objective:   Physical Exam BP 138/86 mmHg  Pulse 70  Temp(Src) 97.7 F (36.5 C) (Oral)  Ht 5\' 5"  (1.651 m)  Wt 174 lb 8 oz (79.153 kg)  BMI 29.04 kg/m2  SpO2 96%  LMP 08/01/2014 General:   Well developed, well nourished . NAD.  HEENT:  Normocephalic . Face symmetric, atraumatic Lungs:  CTA B Normal respiratory effort, no intercostal retractions, no accessory muscle use. Heart: RRR,  no murmur.  No pretibial edema bilaterally  Skin: Not pale. Not jaundice Neurologic:  alert & oriented X3.  Speech normal, gait appropriate for  age and unassisted Psych--  Cognition and judgment appear intact.  Cooperative with normal attention span and concentration.  Behavior appropriate. No anxious or depressed appearing.      Assessment & Plan:

## 2014-09-01 NOTE — Assessment & Plan Note (Addendum)
In the last few days, BP has been slightly elevated in the context of a healthier diet and taking hCG and B12 supplement. Denies taking any other weight loss medications. His BP today here in the office is also higher than before. No clear explanation for slightly increase in blood pressure but patient is concerned. Plan: Increase metoprolol to 75 mg, monitor BPs, let me know if that is not working.

## 2014-10-17 ENCOUNTER — Other Ambulatory Visit: Payer: Self-pay | Admitting: Obstetrics and Gynecology

## 2014-10-17 ENCOUNTER — Other Ambulatory Visit (HOSPITAL_COMMUNITY)
Admission: RE | Admit: 2014-10-17 | Discharge: 2014-10-17 | Disposition: A | Discharge status: Home / Self Care | Payer: 59 | Source: Ambulatory Visit | Attending: Obstetrics and Gynecology | Admitting: Obstetrics and Gynecology

## 2014-10-17 DIAGNOSIS — Z01411 Encounter for gynecological examination (general) (routine) with abnormal findings: Secondary | ICD-10-CM | POA: Diagnosis present

## 2014-10-17 DIAGNOSIS — Z1151 Encounter for screening for human papillomavirus (HPV): Secondary | ICD-10-CM | POA: Diagnosis not present

## 2014-10-19 LAB — CYTOLOGY - PAP

## 2015-02-16 ENCOUNTER — Telehealth: Payer: Self-pay | Admitting: Internal Medicine

## 2015-02-16 NOTE — Addendum Note (Signed)
Addended byDamita Dunnings D on: 02/16/2015 04:54 PM   Modules accepted: Medications

## 2015-02-16 NOTE — Telephone Encounter (Signed)
Pt had at work, [employer], in Oct 2016

## 2015-02-16 NOTE — Telephone Encounter (Signed)
Immunization list updated

## 2015-02-16 NOTE — Telephone Encounter (Signed)
lvm inquiring if patient received flu shot  °

## 2015-06-02 ENCOUNTER — Other Ambulatory Visit: Payer: Self-pay

## 2015-06-02 DIAGNOSIS — Z1231 Encounter for screening mammogram for malignant neoplasm of breast: Secondary | ICD-10-CM

## 2015-06-16 ENCOUNTER — Other Ambulatory Visit: Payer: Self-pay | Admitting: Internal Medicine

## 2015-06-16 ENCOUNTER — Ambulatory Visit
Admission: RE | Admit: 2015-06-16 | Discharge: 2015-06-16 | Disposition: A | Discharge status: Home / Self Care | Payer: 59 | Source: Ambulatory Visit

## 2015-06-16 DIAGNOSIS — Z1231 Encounter for screening mammogram for malignant neoplasm of breast: Secondary | ICD-10-CM

## 2015-07-19 ENCOUNTER — Telehealth: Payer: Self-pay | Admitting: Internal Medicine

## 2015-07-25 NOTE — Telephone Encounter (Signed)
error 

## 2015-08-01 ENCOUNTER — Encounter: Payer: 59 | Admitting: Internal Medicine

## 2015-08-16 ENCOUNTER — Ambulatory Visit (HOSPITAL_BASED_OUTPATIENT_CLINIC_OR_DEPARTMENT_OTHER)
Admission: RE | Admit: 2015-08-16 | Discharge: 2015-08-16 | Disposition: A | Discharge status: Home / Self Care | Payer: 59 | Source: Ambulatory Visit | Attending: Medical | Admitting: Medical

## 2015-08-16 ENCOUNTER — Telehealth: Payer: Self-pay | Admitting: Medical

## 2015-08-16 ENCOUNTER — Ambulatory Visit (INDEPENDENT_AMBULATORY_CARE_PROVIDER_SITE_OTHER): Payer: 59 | Admitting: Medical

## 2015-08-16 ENCOUNTER — Encounter: Payer: Self-pay | Admitting: Medical

## 2015-08-16 VITALS — BP 120/82 | HR 77 | Temp 98.0°F | Resp 16 | Ht 64.5 in | Wt 182.0 lb

## 2015-08-16 DIAGNOSIS — M79661 Pain in right lower leg: Secondary | ICD-10-CM

## 2015-08-16 DIAGNOSIS — T148 Other injury of unspecified body region: Secondary | ICD-10-CM | POA: Diagnosis not present

## 2015-08-16 DIAGNOSIS — T148XXA Other injury of unspecified body region, initial encounter: Secondary | ICD-10-CM

## 2015-08-16 LAB — CBC WITH DIFFERENTIAL/PLATELET
BASOS ABS: 0.1 10*3/uL (ref 0.0–0.1)
Basophils Relative: 0.9 % (ref 0.0–3.0)
EOS ABS: 0 10*3/uL (ref 0.0–0.7)
Eosinophils Relative: 0.6 % (ref 0.0–5.0)
HCT: 38.6 % (ref 36.0–46.0)
Hemoglobin: 13.1 g/dL (ref 12.0–15.0)
LYMPHS ABS: 2.2 10*3/uL (ref 0.7–4.0)
Lymphocytes Relative: 34.7 % (ref 12.0–46.0)
MCHC: 34 g/dL (ref 30.0–36.0)
MCV: 89.3 fl (ref 78.0–100.0)
MONO ABS: 0.6 10*3/uL (ref 0.1–1.0)
Monocytes Relative: 9.1 % (ref 3.0–12.0)
NEUTROS ABS: 3.4 10*3/uL (ref 1.4–7.7)
NEUTROS PCT: 54.7 % (ref 43.0–77.0)
PLATELETS: 299 10*3/uL (ref 150.0–400.0)
RBC: 4.32 Mil/uL (ref 3.87–5.11)
RDW: 13.6 % (ref 11.5–15.5)
WBC: 6.3 10*3/uL (ref 4.0–10.5)

## 2015-08-16 NOTE — Patient Instructions (Addendum)
Your calf pain and bruising may have been result of mild trauma. But will get cbc and assess platelets.. Will assess faint calf discomfort and small lump with US(2:30 this afternoon). Will update you on the results of tests. I think another week or so and bruise area will resolve.  Follow up 7-10 days or as needed.

## 2015-08-16 NOTE — Progress Notes (Addendum)
Subjective:    Patient ID: DYLYNN MORONEY, female    DOB: 1965/01/25, 50 y.o.   MRN: [id number]  HPI  Pt had small bruise in his rt lower calf/extremity about one month ago. No fall or trauma. She does exercise. Pt denies any abnormal soreness. Pt has not been on any new medications. The bruise has gradually gotten small and faint light bruised appearance now. No history of easy brusing in the past. No pain behind her knee.   Review of Systems  Constitutional: Negative for chills, fatigue and fever.  Respiratory: Negative for cough, chest tightness and shortness of breath.   Cardiovascular: Negative for chest pain and palpitations.  Musculoskeletal:       Faint calf calf pain.  Hematological: Negative for adenopathy. Bruises/bleeds easily.  Psychiatric/Behavioral: Negative for behavioral problems and confusion.    Past Medical History:  Diagnosis Date  . HTN (hypertension) 2009  . Hyperlipidemia 2009  . Migraines    weather related      Social History   Social History  . Marital status: Married    Spouse name: N/A  . Number of children: 2  . 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 Smoking status: Never Smoker  . Smokeless tobacco: Never Used  . Alcohol use 2.0 oz/week    4 drink(s) per week  . Drug use: No  . Sexual activity: Not on file   Other Topics Concern  . Not on file   Social History Narrative   Lives w/ husband, 2 children, 2 Gkids   Original from [place]     Past Surgical History:  Procedure Laterality Date  . CARPAL TUNNEL RELEASE Bilateral 2006  . CESAREAN SECTION  1990   x1  . TUBAL LIGATION  1991    Family History  Problem Relation Age of Onset  . Colon cancer Neg Hx   . Breast cancer Other     aunt , dx in her 74  . Prostate cancer Father   . CAD Mother     M had a MI , first in her mid 86s, heavy ETOH-Tobacco  . Diabetes Neg Hx   . Hyperlipidemia Mother   .  Hypertension Mother   . Lung cancer Brother     Allergies  Allergen Reactions  . Tetracyclines & Related     Hives and nausea    Current Outpatient Prescriptions on File Prior to Visit  Medication Sig Dispense Refill  . aspirin 81 MG tablet Take 81 mg by mouth daily.    Marland Kitchen MAGNESIUM CITRATE PO Take 1 tablet by mouth daily.    . METHYLCOBALAMIN PO Place 1 tablet under the tongue daily. With HCG    . Omega-3 Fatty Acids (FISH OIL PO) Take by mouth daily.    . Probiotic Product (PROBIOTIC PO) Take 1 tablet by mouth daily. Intestinal Formula    . Pyridoxine HCl (B-6 PO) Take 1 tablet by mouth daily.    . Solifenacin Succinate (VESICARE PO) Take by mouth daily.     No current facility-administered medications on file prior to visit.     BP 120/82 (BP Location: Right Arm, Patient Position: Sitting, Cuff Size: Normal)   Pulse 77   Temp 98 F (36.7 C) (Oral)   Resp 16   Ht 5' 4.5" (1.638 m)   Wt 182 lb (82.6 kg)   LMP 08/01/2014   SpO2 98%  BMI 30.76 kg/m       Objective:   Physical Exam   General- No acute distress. Pleasant patient. Neck- Full range of motion, no jvd Lungs- Clear, even and unlabored. Heart- regular rate and rhythm. Neurologic- CNII- XII grossly intact.  Rt lower ext- distal posterior calf very faint bruising. No pain on palpation. No warmth. Calf not swollen. Neg homans sign. On edge bruise(2 cm x 2 cm)area small circular cyst type mass size of pea.         Assessment & Plan:  Your calf pain and bruising may have been result of mild trauma. But will get cbc and assess platelets.. Will assess faint calf discomfort and small lump with [place]. Will update you on the results of tests. I think another week or so and bruise area will resolve.  Follow up  7-10 days or as needed.  Grover [place]field, Percell Miller, PA-C

## 2015-08-16 NOTE — Telephone Encounter (Signed)
Please note cbc is normal. I am awaitiing result of her lower ext [place]. But xray of clavicle applied to other pt. So when you call pt leave out portion about clavicle.

## 2015-08-29 ENCOUNTER — Telehealth: Payer: Self-pay | Admitting: Internal Medicine

## 2015-08-29 MED ORDER — PRAVASTATIN SODIUM 40 MG PO TABS: 40.0000 mg | ORAL_TABLET | Freq: Every day | ORAL | 0 refills | Status: DC

## 2015-08-29 NOTE — Telephone Encounter (Signed)
Pt last seen by PCP 08/2014, CPE scheduled 10/2015. Rx [facility] to [facility] [address].

## 2015-08-29 NOTE — Telephone Encounter (Signed)
Pt says that [place] is going [place] and need a refill of Pravastatin sent to her local pharmacy if possible.     [facility] [address] [place].    Please call pt for confirmation.

## 2015-08-30 NOTE — Telephone Encounter (Signed)
Pt made aware that Rx is at pharmacy for pick up

## 2015-10-03 ENCOUNTER — Other Ambulatory Visit: Payer: Self-pay | Admitting: Internal Medicine

## 2015-10-27 ENCOUNTER — Other Ambulatory Visit: Payer: Self-pay | Admitting: Internal Medicine

## 2015-10-27 ENCOUNTER — Ambulatory Visit (INDEPENDENT_AMBULATORY_CARE_PROVIDER_SITE_OTHER): Payer: 59 | Admitting: Internal Medicine

## 2015-10-27 ENCOUNTER — Encounter: Payer: Self-pay | Admitting: Internal Medicine

## 2015-10-27 VITALS — BP 122/72 | HR 59 | Temp 98.2°F | Resp 14 | Ht 65.0 in | Wt 181.2 lb

## 2015-10-27 DIAGNOSIS — Z0000 Encounter for general adult medical examination without abnormal findings: Secondary | ICD-10-CM | POA: Diagnosis not present

## 2015-10-27 LAB — BASIC METABOLIC PANEL
BUN: 10 mg/dL (ref 7–25)
CHLORIDE: 103 mmol/L (ref 98–110)
CO2: 27 mmol/L (ref 20–31)
Calcium: 9.1 mg/dL (ref 8.6–10.4)
Creat: 0.78 mg/dL (ref 0.50–1.05)
GLUCOSE: 87 mg/dL (ref 65–99)
POTASSIUM: 4.5 mmol/L (ref 3.5–5.3)
Sodium: 141 mmol/L (ref 135–146)

## 2015-10-27 LAB — AST: AST: 22 U/L (ref 10–35)

## 2015-10-27 LAB — LIPID PANEL
CHOL/HDL RATIO: 2.3 ratio (ref <=5.0)
Cholesterol: 180 mg/dL (ref 125–200)
HDL: 77 mg/dL (ref >=46)
LDL CALC: 78 mg/dL (ref <130)
Triglycerides: 123 mg/dL (ref <150)
VLDL: 25 mg/dL (ref <30)

## 2015-10-27 LAB — ALT: ALT: 16 U/L (ref 6–29)

## 2015-10-27 NOTE — Patient Instructions (Signed)
GO TO THE LAB : Get the blood work     GO TO THE FRONT DESK Schedule your next appointment for a  Physical exam in 1 year 

## 2015-10-27 NOTE — Assessment & Plan Note (Addendum)
Td --2015 CCS, had a cscope 10-2013 Female care per  Gyn, last visit a week ago MMG 06-2015 Plan:   active , exercises most days. Doing well with diet, some room for improvement. Advise provided

## 2015-10-27 NOTE — Progress Notes (Signed)
Subjective:    Patient ID: Cindy Mooney, female    DOB: [birth year], 50 y.o.   MRN: [id number]  DOS:  10/27/2015 Type of visit - description : cpx Interval history: no major concerns    Review of Systems Constitutional: No fever. No chills. No unexplained wt changes. No unusual sweats  HEENT: No dental problems, no ear discharge, no facial swelling, no voice changes. No eye discharge, no eye  redness , no  intolerance to light   Respiratory: No wheezing , no  difficulty breathing. No cough , no mucus production  Cardiovascular: No CP, no leg swelling , no  Palpitations  GI: no nausea, no vomiting, no diarrhea , no  abdominal pain.  No blood in the stools. No dysphagia, no odynophagia    Endocrine: No polyphagia, no polyuria , no polydipsia  GU: No dysuria, gross hematuria, difficulty urinating. No urinary urgency, no frequency.  Musculoskeletal: No joint swellings or unusual aches or pains  Skin: No change in the color of the skin, palor , no  Rash  Allergic, immunologic: No environmental allergies , no  food allergies  Neurological: No dizziness no  syncope. No headaches. No diplopia, no slurred, no slurred speech, no motor deficits, no facial  Numbness  Hematological: No enlarged lymph nodes, no easy bruising , no unusual bleedings  Psychiatry: No suicidal ideas, no hallucinations, no beavior problems, no confusion.  No unusual/severe anxiety, no depression    Past Medical History:  Diagnosis Date  . HTN (hypertension) 2009  . Hyperlipidemia 2009  . Migraines    weather related     Past Surgical History:  Procedure Laterality Date  . CARPAL TUNNEL RELEASE Bilateral 2006  . [place]    Social History   Social History  . Marital status: Married    Spouse name: N/A  . Number of children: 2  . 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 Smoking  status: Never Smoker  . Smokeless tobacco: Never Used  . Alcohol use 2.0 oz/week    4 drink(s) per week  . Drug use: No  . Sexual activity: Not on file   Other Topics Concern  . Not on file   Social History Narrative   Lives w/ husband, 2 children, 2 Gkids   Original from [place]      Family History  Problem Relation Age of Onset  . Breast cancer Other     aunt , dx in her 50  . Prostate cancer Father   . CAD Mother     M had a MI , first in her mid 6s, heavy ETOH-Tobacco  . Hyperlipidemia Mother   . Hypertension Mother   . Lung cancer Brother   . Colon cancer Neg Hx   . Diabetes Neg Hx        Medication List       Accurate as of 10/27/15 11:59 PM. Always use your most recent med list.          aspirin 81 MG tablet Take 81 mg by mouth daily.   FISH OIL PO Take by mouth daily.   MULTIVITAMIN PO Take 1 tablet by mouth daily.   nebivolol 10 MG tablet Commonly known as:  BYSTOLIC Take 10 mg by mouth.   pravastatin 40 MG tablet Commonly known as:  PRAVACHOL Take 1 tablet (40  mg total) by mouth daily.   VESICARE PO Take by mouth daily.          Objective:   Physical Exam BP 122/72 (BP Location: Left Arm, Patient Position: Sitting, Cuff Size: Normal)   Pulse (!) 59   Temp 98.2 F (36.8 C) (Oral)   Resp 14   Ht 5\' 5"  (1.651 m)   Wt 181 lb 4 oz (82.2 kg)   LMP 08/01/2014   SpO2 98%   BMI 30.16 kg/m  General:   Well developed, well nourished . NAD.  Neck: No  thyromegaly  HEENT:  Normocephalic . Face symmetric, atraumatic Lungs:  CTA B Normal respiratory effort, no intercostal retractions, no accessory muscle use. Heart: RRR,  no murmur.  No pretibial edema bilaterally  Abdomen:  Not distended, soft, non-tender. No rebound or rigidity.   Skin: Exposed areas without rash. Not pale. Not jaundice Neurologic:  alert & oriented X3.  Speech normal, gait appropriate for age and unassisted Strength symmetric and appropriate for age.    Psych: Cognition and judgment appear intact.  Cooperative with normal attention span and concentration.  Behavior appropriate. No anxious or depressed appearing.     Assessment & Plan:   Assessment HTN Hyperlipidemia Migraines OAB Sees derm q 6-12 months  FH CAD- sees cards   PLAN: HTN: Controlled on bystolic Hyperlipidemia: Used to be on Lipitor but due to self perceived difficulty with memory switched to Pravachol. Will check labs. Refill Pravachol. Family history of CAD: Continue aspirin and CV RF control. Sees cardiology. RTC one year

## 2015-10-27 NOTE — Progress Notes (Signed)
Pre visit review using our clinic review tool, if applicable. No additional management support is needed unless otherwise documented below in the visit note. 

## 2015-10-29 DIAGNOSIS — Z09 Encounter for follow-up examination after completed treatment for conditions other than malignant neoplasm: Secondary | ICD-10-CM | POA: Insufficient documentation

## 2015-10-29 NOTE — Assessment & Plan Note (Signed)
HTN: Controlled on bystolic Hyperlipidemia: Used to be on Lipitor but due to self perceived difficulty with memory switched to Pravachol. Will check labs. Refill Pravachol. Family history of CAD: Continue aspirin and CV RF control. Sees cardiology. RTC one year

## 2015-10-30 MED ORDER — PRAVASTATIN SODIUM 40 MG PO TABS: 40.0000 mg | ORAL_TABLET | Freq: Every day | ORAL | 3 refills | Status: DC

## 2015-10-30 NOTE — Addendum Note (Signed)
Addended byDamita Dunnings D on: 10/30/2015 07:51 AM   Modules accepted: Orders

## 2015-11-26 ENCOUNTER — Other Ambulatory Visit: Payer: Self-pay | Admitting: Internal Medicine

## 2016-08-06 ENCOUNTER — Other Ambulatory Visit: Payer: Self-pay | Admitting: Internal Medicine

## 2016-08-06 DIAGNOSIS — Z1231 Encounter for screening mammogram for malignant neoplasm of breast: Secondary | ICD-10-CM

## 2016-08-07 DIAGNOSIS — L570 Actinic keratosis: Secondary | ICD-10-CM | POA: Diagnosis not present

## 2016-08-07 DIAGNOSIS — L814 Other melanin hyperpigmentation: Secondary | ICD-10-CM | POA: Diagnosis not present

## 2016-08-07 DIAGNOSIS — L821 Other seborrheic keratosis: Secondary | ICD-10-CM | POA: Diagnosis not present

## 2016-08-07 DIAGNOSIS — D1801 Hemangioma of skin and subcutaneous tissue: Secondary | ICD-10-CM | POA: Diagnosis not present

## 2016-08-12 ENCOUNTER — Ambulatory Visit
Admission: RE | Admit: 2016-08-12 | Discharge: 2016-08-12 | Disposition: A | Discharge status: Home / Self Care | Payer: 59 | Source: Ambulatory Visit | Attending: Internal Medicine | Admitting: Internal Medicine

## 2016-08-12 DIAGNOSIS — Z1231 Encounter for screening mammogram for malignant neoplasm of breast: Secondary | ICD-10-CM | POA: Diagnosis not present

## 2016-08-15 ENCOUNTER — Encounter: Payer: Self-pay | Admitting: Internal Medicine

## 2016-09-20 DIAGNOSIS — J069 Acute upper respiratory infection, unspecified: Secondary | ICD-10-CM | POA: Diagnosis not present

## 2016-09-20 DIAGNOSIS — J029 Acute pharyngitis, unspecified: Secondary | ICD-10-CM | POA: Diagnosis not present

## 2016-10-21 DIAGNOSIS — N924 Excessive bleeding in the premenopausal period: Secondary | ICD-10-CM | POA: Diagnosis not present

## 2016-10-21 DIAGNOSIS — Z01411 Encounter for gynecological examination (general) (routine) with abnormal findings: Secondary | ICD-10-CM | POA: Diagnosis not present

## 2016-10-27 ENCOUNTER — Other Ambulatory Visit: Payer: Self-pay | Admitting: Internal Medicine

## 2016-10-29 ENCOUNTER — Encounter: Payer: 59 | Admitting: Internal Medicine

## 2016-11-07 ENCOUNTER — Other Ambulatory Visit: Payer: Self-pay | Admitting: Internal Medicine

## 2016-11-07 NOTE — Telephone Encounter (Signed)
Patient called and is requesting a refill on BYSTOLIC., Patient uses to [facility]. Please contact patient back once it has been approve and refilled.

## 2016-11-08 MED ORDER — NEBIVOLOL HCL 10 MG PO TABS: 10.0000 mg | ORAL_TABLET | Freq: Every day | ORAL | 0 refills | Status: DC

## 2016-11-08 MED ORDER — PRAVASTATIN SODIUM 40 MG PO TABS: 40.0000 mg | ORAL_TABLET | Freq: Every day | ORAL | 0 refills | Status: DC

## 2016-11-08 NOTE — Telephone Encounter (Signed)
Refill done/called the patient to inform her done/also reminded of appt with PCP next week and to keep that appt. In order to continue getting refills.

## 2016-11-11 DIAGNOSIS — D219 Benign neoplasm of connective and other soft tissue, unspecified: Secondary | ICD-10-CM | POA: Diagnosis not present

## 2016-11-11 DIAGNOSIS — N924 Excessive bleeding in the premenopausal period: Secondary | ICD-10-CM | POA: Diagnosis not present

## 2016-11-18 ENCOUNTER — Ambulatory Visit (INDEPENDENT_AMBULATORY_CARE_PROVIDER_SITE_OTHER): Payer: 59 | Admitting: Internal Medicine

## 2016-11-18 ENCOUNTER — Encounter: Payer: Self-pay | Admitting: Internal Medicine

## 2016-11-18 VITALS — BP 126/78 | HR 59 | Temp 97.9°F | Resp 14 | Ht 65.0 in | Wt 177.4 lb

## 2016-11-18 DIAGNOSIS — Z0000 Encounter for general adult medical examination without abnormal findings: Secondary | ICD-10-CM

## 2016-11-18 DIAGNOSIS — Z1159 Encounter for screening for other viral diseases: Secondary | ICD-10-CM | POA: Diagnosis not present

## 2016-11-18 MED ORDER — NEBIVOLOL HCL 10 MG PO TABS: 10.0000 mg | ORAL_TABLET | Freq: Every day | ORAL | 3 refills | Status: DC

## 2016-11-18 MED ORDER — AZELASTINE HCL 0.1 % NA SOLN: 2.0000 | Freq: Two times a day (BID) | NASAL | 6 refills | Status: DC

## 2016-11-18 NOTE — Progress Notes (Signed)
Pre visit review using our clinic review tool, if applicable. No additional management support is needed unless otherwise documented below in the visit note. 

## 2016-11-18 NOTE — Progress Notes (Signed)
Subjective:    Patient ID: Cindy Mooney, female    DOB: 10/04/1965, 51 y.o.   MRN: [id number]  DOS:  11/18/2016 Type of visit - description : cpx Interval history: In general feeling well, no concerns   Review of Systems Occasional headaches, usually related to weather changes, Excedrin helps but not as much as it did before. Had a cold a few days ago, feeling much better.  Other than above, a 14 point review of systems is negative    Past Medical History:  Diagnosis Date  . HTN (hypertension) 2009  . Hyperlipidemia 2009  . Migraines    weather related     Past Surgical History:  Procedure Laterality Date  . CARPAL TUNNEL RELEASE Bilateral 2006  . [place]    Social History   Socioeconomic History  . Marital status: Married    Spouse name: Not on file  . Number of children: 2  . Years of education: Not on file  . Highest education level: Not on file  Social Needs  . Financial resource strain: Not on file  . Food insecurity - worry: Not on file  . Food insecurity - inability: Not on file  . Transportation needs - medical: Not on file  . Transportation needs - non-medical: Not on file  Occupational History  . Occupation: Administrator at [employer]  . Smoking status: Never Smoker  . Smokeless tobacco: Never Used  Substance and Sexual Activity  . Alcohol use: Yes    Alcohol/week: 2.0 oz    Types: 4 drink(s) per week  . Drug use: No  . Sexual activity: Not on file  Other Topics Concern  . Not on file  Social History Narrative   Lives w/ husband   2 daughters, 1 in [place], 1 in [place] (Radio producer) w/  2 Gkids   Original from [place]      Family History  Problem Relation Age of Onset  . Breast cancer Other        aunt , dx in her 19  . Prostate cancer Father   . CAD Mother        M had a MI , first in her mid 34s, heavy ETOH-Tobacco  . Hyperlipidemia Mother   . Hypertension Mother   . Lung  cancer Brother   . Colon cancer Neg Hx   . Diabetes Neg Hx     Allergies as of 11/18/2016      Reactions   Tetracyclines & Related    Hives and nausea      Medication List        Accurate as of 11/18/16 11:59 PM. Always use your most recent med list.          aspirin 81 MG tablet Take 81 mg by mouth daily.   azelastine 0.1 % nasal spray Commonly known as:  ASTELIN Place 2 sprays 2 (two) times daily into both nostrils.   MULTIVITAMIN PO Take 1 tablet by mouth daily.   nebivolol 10 MG tablet Commonly known as:  BYSTOLIC Take 1 tablet (10 mg total) daily by mouth.   pravastatin 40 MG tablet Commonly known as:  PRAVACHOL Take 1 tablet (40 mg total) by mouth daily.          Objective:   Physical Exam BP 126/78 (BP Location: Left Arm, Patient Position: Sitting, Cuff Size: Small)   Pulse (!) 59   Temp  97.9 F (36.6 C) (Oral)   Resp 14   Ht 5\' 5"  (1.651 m)   Wt 177 lb 6 oz (80.5 kg)   LMP 08/01/2014   SpO2 98%   BMI 29.52 kg/m  General:   Well developed, well nourished . NAD.  Neck: No  thyromegaly  HEENT:  Normocephalic . Face symmetric, atraumatic TMs normal, nose is slightly congested, sinuses non-TTP, throat symmetric, normal rate. Lungs:  CTA B Normal respiratory effort, no intercostal retractions, no accessory muscle use. Heart: RRR,  no murmur.  No pretibial edema bilaterally  Abdomen:  Not distended, soft, non-tender. No rebound or rigidity.   Skin: Exposed areas without rash. Not pale. Not jaundice Neurologic:  alert & oriented X3.  Speech normal, gait appropriate for age and unassisted Strength symmetric and appropriate for age.  Psych: Cognition and judgment appear intact.  Cooperative with normal attention span and concentration.  Behavior appropriate. No anxious or depressed appearing.     Assessment & Plan:   Assessment HTN Hyperlipidemia Migraines OAB Sees derm q 6-12 months  FH CAD- sees cards   PLAN: HTN: Seems well  controlled on Bystolic, RF sent Hyperlipidemia: On Pravachol, check labs, RF with results HAs: Slightly different than migraines, frontal/sinus pain with weather changes, continue with Excedrin which helps, also trial w/ Astelin + FH CAD: Continue ASA and CV RF control RTC 1 year

## 2016-11-18 NOTE — Patient Instructions (Signed)
  GO TO THE FRONT DESK Schedule your next appointment for a  Physical exam in 1 year  Schedule labs to be done fasting, this week    If headaches: Excedrin OTC, try Astelin twice a day

## 2016-11-18 NOTE — Assessment & Plan Note (Addendum)
-  Td:2015. Had a flu shot. -CCS, had a cscope 10-2013, 5 years per letter  -Female care per  Gyn, yearly visits, MMG 08-2016 (-) - Lifestyle is healthy.  Continue to be active and eats well. - Labs: Will RTC fasting for CMP, FLP, CBC, TSH, hep C

## 2016-11-19 ENCOUNTER — Other Ambulatory Visit (INDEPENDENT_AMBULATORY_CARE_PROVIDER_SITE_OTHER): Payer: 59

## 2016-11-19 DIAGNOSIS — Z0000 Encounter for general adult medical examination without abnormal findings: Secondary | ICD-10-CM | POA: Diagnosis not present

## 2016-11-19 DIAGNOSIS — Z1159 Encounter for screening for other viral diseases: Secondary | ICD-10-CM | POA: Diagnosis not present

## 2016-11-19 LAB — COMPREHENSIVE METABOLIC PANEL
ALK PHOS: 77 U/L (ref 39–117)
ALT: 19 U/L (ref 0–35)
AST: 20 U/L (ref 0–37)
Albumin: 3.9 g/dL (ref 3.5–5.2)
BUN: 11 mg/dL (ref 6–23)
CHLORIDE: 103 meq/L (ref 96–112)
CO2: 29 mEq/L (ref 19–32)
Calcium: 9.2 mg/dL (ref 8.4–10.5)
Creatinine, Ser: 0.69 mg/dL (ref 0.40–1.20)
GFR: 94.44 mL/min (ref >60.00)
GLUCOSE: 87 mg/dL (ref 70–99)
POTASSIUM: 4.5 meq/L (ref 3.5–5.1)
SODIUM: 138 meq/L (ref 135–145)
TOTAL PROTEIN: 6.6 g/dL (ref 6.0–8.3)
Total Bilirubin: 0.4 mg/dL (ref 0.2–1.2)

## 2016-11-19 LAB — CBC WITH DIFFERENTIAL/PLATELET
BASOS PCT: 1.2 % (ref 0.0–3.0)
Basophils Absolute: 0.1 10*3/uL (ref 0.0–0.1)
EOS PCT: 0.6 % (ref 0.0–5.0)
Eosinophils Absolute: 0 10*3/uL (ref 0.0–0.7)
HCT: 41.3 % (ref 36.0–46.0)
Hemoglobin: 13.4 g/dL (ref 12.0–15.0)
LYMPHS ABS: 2.1 10*3/uL (ref 0.7–4.0)
Lymphocytes Relative: 39.3 % (ref 12.0–46.0)
MCHC: 32.4 g/dL (ref 30.0–36.0)
MCV: 91.8 fl (ref 78.0–100.0)
MONOS PCT: 9.4 % (ref 3.0–12.0)
Monocytes Absolute: 0.5 10*3/uL (ref 0.1–1.0)
NEUTROS ABS: 2.7 10*3/uL (ref 1.4–7.7)
Neutrophils Relative %: 49.5 % (ref 43.0–77.0)
Platelets: 326 10*3/uL (ref 150.0–400.0)
RBC: 4.5 Mil/uL (ref 3.87–5.11)
RDW: 15 % (ref 11.5–15.5)
WBC: 5.4 10*3/uL (ref 4.0–10.5)

## 2016-11-19 LAB — LIPID PANEL
Cholesterol: 177 mg/dL (ref 0–200)
HDL: 72.6 mg/dL (ref >39.00)
LDL Cholesterol: 85 mg/dL (ref 0–99)
NONHDL: 104.01
Total CHOL/HDL Ratio: 2
Triglycerides: 95 mg/dL (ref 0.0–149.0)
VLDL: 19 mg/dL (ref 0.0–40.0)

## 2016-11-19 LAB — TSH: TSH: 2.06 u[IU]/mL (ref 0.35–4.50)

## 2016-11-19 NOTE — Assessment & Plan Note (Signed)
HTN: Seems well controlled on Bystolic, RF sent Hyperlipidemia: On Pravachol, check labs, RF with results HAs: Slightly different than migraines, frontal/sinus pain with weather changes, continue with Excedrin which helps, also trial w/ Astelin + FH CAD: Continue ASA and CV RF control RTC 1 year

## 2016-11-20 LAB — HEPATITIS C ANTIBODY
Hepatitis C Ab: NONREACTIVE
SIGNAL TO CUT-OFF: 0 (ref <1.00)

## 2016-12-19 ENCOUNTER — Other Ambulatory Visit: Payer: Self-pay | Admitting: Obstetrics and Gynecology

## 2017-01-16 ENCOUNTER — Other Ambulatory Visit: Payer: Self-pay | Admitting: Internal Medicine

## 2017-01-23 DIAGNOSIS — E785 Hyperlipidemia, unspecified: Secondary | ICD-10-CM | POA: Diagnosis not present

## 2017-01-23 DIAGNOSIS — I10 Essential (primary) hypertension: Secondary | ICD-10-CM | POA: Diagnosis not present

## 2017-02-04 DIAGNOSIS — N924 Excessive bleeding in the premenopausal period: Secondary | ICD-10-CM | POA: Diagnosis not present

## 2017-02-26 DIAGNOSIS — M7542 Impingement syndrome of left shoulder: Secondary | ICD-10-CM | POA: Diagnosis not present

## 2017-02-26 DIAGNOSIS — M25512 Pain in left shoulder: Secondary | ICD-10-CM | POA: Diagnosis not present

## 2017-07-01 ENCOUNTER — Other Ambulatory Visit: Payer: Self-pay | Admitting: Internal Medicine

## 2017-07-01 DIAGNOSIS — Z1231 Encounter for screening mammogram for malignant neoplasm of breast: Secondary | ICD-10-CM

## 2017-08-18 ENCOUNTER — Ambulatory Visit
Admission: RE | Admit: 2017-08-18 | Discharge: 2017-08-18 | Disposition: A | Discharge status: Home / Self Care | Payer: 59 | Source: Ambulatory Visit | Attending: Internal Medicine | Admitting: Internal Medicine

## 2017-08-18 DIAGNOSIS — Z1231 Encounter for screening mammogram for malignant neoplasm of breast: Secondary | ICD-10-CM | POA: Diagnosis not present

## 2017-11-13 ENCOUNTER — Other Ambulatory Visit: Payer: Self-pay | Admitting: Obstetrics and Gynecology

## 2017-11-13 ENCOUNTER — Other Ambulatory Visit (HOSPITAL_COMMUNITY)
Admission: RE | Admit: 2017-11-13 | Discharge: 2017-11-13 | Disposition: A | Discharge status: Home / Self Care | Payer: 59 | Source: Ambulatory Visit | Attending: Obstetrics and Gynecology | Admitting: Obstetrics and Gynecology

## 2017-11-13 DIAGNOSIS — Z01411 Encounter for gynecological examination (general) (routine) with abnormal findings: Secondary | ICD-10-CM | POA: Insufficient documentation

## 2017-11-13 LAB — RESULTS CONSOLE HPV: CHL HPV: NEGATIVE

## 2017-11-17 LAB — CYTOLOGY - PAP
Diagnosis: NEGATIVE
HPV (WINDOPATH): NOT DETECTED

## 2017-11-20 ENCOUNTER — Ambulatory Visit (INDEPENDENT_AMBULATORY_CARE_PROVIDER_SITE_OTHER): Payer: 59 | Admitting: Internal Medicine

## 2017-11-20 ENCOUNTER — Encounter: Payer: Self-pay | Admitting: Internal Medicine

## 2017-11-20 VITALS — BP 126/70 | HR 53 | Temp 97.8°F | Resp 16 | Ht 65.0 in | Wt 165.1 lb

## 2017-11-20 DIAGNOSIS — Z23 Encounter for immunization: Secondary | ICD-10-CM | POA: Diagnosis not present

## 2017-11-20 DIAGNOSIS — Z0000 Encounter for general adult medical examination without abnormal findings: Secondary | ICD-10-CM

## 2017-11-20 LAB — CBC WITH DIFFERENTIAL/PLATELET
BASOS PCT: 2.3 % (ref 0.0–3.0)
Basophils Absolute: 0.1 10*3/uL (ref 0.0–0.1)
Eosinophils Absolute: 0.1 10*3/uL (ref 0.0–0.7)
Eosinophils Relative: 1.3 % (ref 0.0–5.0)
HCT: 42.6 % (ref 36.0–46.0)
HEMOGLOBIN: 14.4 g/dL (ref 12.0–15.0)
LYMPHS ABS: 2.2 10*3/uL (ref 0.7–4.0)
Lymphocytes Relative: 45.3 % (ref 12.0–46.0)
MCHC: 33.9 g/dL (ref 30.0–36.0)
MCV: 92.5 fl (ref 78.0–100.0)
MONO ABS: 0.4 10*3/uL (ref 0.1–1.0)
MONOS PCT: 7.9 % (ref 3.0–12.0)
Neutro Abs: 2.1 10*3/uL (ref 1.4–7.7)
Neutrophils Relative %: 43.2 % (ref 43.0–77.0)
Platelets: 278 10*3/uL (ref 150.0–400.0)
RBC: 4.6 Mil/uL (ref 3.87–5.11)
RDW: 13.5 % (ref 11.5–15.5)
WBC: 4.9 10*3/uL (ref 4.0–10.5)

## 2017-11-20 LAB — LIPID PANEL
CHOLESTEROL: 247 mg/dL — AB (ref 0–200)
HDL: 84.1 mg/dL (ref >39.00)
LDL Cholesterol: 143 mg/dL — ABNORMAL HIGH (ref 0–99)
NONHDL: 162.73
Total CHOL/HDL Ratio: 3
Triglycerides: 99 mg/dL (ref 0.0–149.0)
VLDL: 19.8 mg/dL (ref 0.0–40.0)

## 2017-11-20 LAB — COMPREHENSIVE METABOLIC PANEL
ALK PHOS: 91 U/L (ref 39–117)
ALT: 19 U/L (ref 0–35)
AST: 20 U/L (ref 0–37)
Albumin: 4.4 g/dL (ref 3.5–5.2)
BILIRUBIN TOTAL: 0.4 mg/dL (ref 0.2–1.2)
BUN: 13 mg/dL (ref 6–23)
CO2: 30 mEq/L (ref 19–32)
Calcium: 9.6 mg/dL (ref 8.4–10.5)
Chloride: 102 mEq/L (ref 96–112)
Creatinine, Ser: 0.64 mg/dL (ref 0.40–1.20)
GFR: 102.62 mL/min (ref >60.00)
Glucose, Bld: 80 mg/dL (ref 70–99)
POTASSIUM: 4.4 meq/L (ref 3.5–5.1)
Sodium: 139 mEq/L (ref 135–145)
TOTAL PROTEIN: 7 g/dL (ref 6.0–8.3)

## 2017-11-20 NOTE — Progress Notes (Signed)
Subjective:    Patient ID: Cindy Mooney, female    DOB: 31-Oct-1965, 52 y.o.   MRN: [id number]  DOS:  11/20/2017 Type of visit - description : cpx Interval history: Feeling great. LMP was in June 2019,  she already discussed that with gynecology.  She actually has no menopausal  symptoms.   Review of Systems  Other than above, a 14 point review of systems is negative     Past Medical History:  Diagnosis Date  . HTN (hypertension) 2009  . Hyperlipidemia 2009  . Migraines    weather related     Past Surgical History:  Procedure Laterality Date  . CARPAL TUNNEL RELEASE Bilateral 2006  . [place]    Social History   Socioeconomic History  . Marital status: Married    Spouse name: Not on file  . Number of children: 2  . Years of education: Not on file  . Highest education level: Not on file  Occupational History  . Occupation: Administrator at [employer]  . Financial resource strain: Not on file  . Food insecurity:    Worry: Not on file    Inability: Not on file  . Transportation needs:    Medical: Not on file    Non-medical: Not on file  Tobacco Use  . Smoking status: Never Smoker  . Smokeless tobacco: Never Used  Substance and Sexual Activity  . Alcohol use: Yes    Alcohol/week: 4.0 standard drinks    Types: 4 drink(s) per week  . Drug use: No  . Sexual activity: Not on file  Lifestyle  . Physical activity:    Days per week: Not on file    Minutes per session: Not on file  . Stress: Not on file  Relationships  . Social connections:    Talks on phone: Not on file    Gets together: Not on file    Attends religious service: Not on file    Active member of club or organization: Not on file    Attends meetings of clubs or organizations: Not on file    Relationship status: Not on file  . Intimate partner violence:    Fear of current or ex partner: Not on file    Emotionally abused: Not on file   Physically abused: Not on file    Forced sexual activity: Not on file  Other Topics Concern  . Not on file  Social History Narrative   Lives w/ husband   2 daughters, 1 in [place], 1 in [place] (Radio producer) w/  2 Gkids   Original from [place]      Family History  Problem Relation Age of Onset  . Breast cancer Other        aunt , dx in her 65  . Prostate cancer Father   . CAD Mother        M had a MI , first in her mid 49s, heavy ETOH-Tobacco  . Hyperlipidemia Mother   . Hypertension Mother   . Lung cancer Brother   . Colon cancer Neg Hx   . Diabetes Neg Hx      Allergies as of 11/20/2017      Reactions   Tetracyclines & Related    Hives and nausea      Medication List        Accurate as of 11/20/17 11:59 PM. Always use your most recent med  list.          aspirin 81 MG tablet Take 81 mg by mouth daily.   MULTIVITAMIN PO Take 1 tablet by mouth daily.   nebivolol 10 MG tablet Commonly known as:  BYSTOLIC Take 1 tablet (10 mg total) daily by mouth.   pravastatin 40 MG tablet Commonly known as:  PRAVACHOL Take 1 tablet (40 mg total) by mouth daily.          Objective:   Physical Exam BP 126/70 (BP Location: Left Arm, Patient Position: Sitting, Cuff Size: Small)   Pulse (!) 53   Temp 97.8 F (36.6 C) (Oral)   Resp 16   Ht 5\' 5"  (1.651 m)   Wt 165 lb 2 oz (74.9 kg)   LMP 08/01/2014   SpO2 98%   BMI 27.48 kg/m  General: Well developed, NAD, BMI noted Neck: No  thyromegaly  HEENT:  Normocephalic . Face symmetric, atraumatic Lungs:  CTA B Normal respiratory effort, no intercostal retractions, no accessory muscle use. Heart: RRR,  no murmur.  No pretibial edema bilaterally  Abdomen:  Not distended, soft, non-tender. No rebound or rigidity.   Skin: Exposed areas without rash. Not pale. Not jaundice Neurologic:  alert & oriented X3.  Speech normal, gait appropriate for age and unassisted Strength symmetric and appropriate for age.   Psych: Cognition and judgment appear intact.  Cooperative with normal attention span and concentration.  Behavior appropriate. No anxious or depressed appearing.     Assessment & Plan:   Assessment HTN Hyperlipidemia Migraines OAB Sees derm q 6-12 months  FH CAD- sees cards  Menopausal  PLAN: HTN: Continue Bystolic, RF as needed Hyperlipidemia: On Pravachol, check labs  + FH CAD: Continue ASA and CV RF control, to see cardiology soon, states that likely they will do a calcium coronary score. RTC 1 year

## 2017-11-20 NOTE — Assessment & Plan Note (Signed)
-  Td:2015. Had a flu shot.  Shingrix discussed, provided the first dose today, next in 2 to 3 months -CCS, had a cscope 10-2013, 5 years per letter  -Female care per  Gyn, had a recent visit w/ a Pap smear. MMG 08-2017 (-) - Lifestyle is very healthy. Praised. - Labs: flp cmp cbc

## 2017-11-20 NOTE — Progress Notes (Signed)
Pre visit review using our clinic review tool, if applicable. No additional management support is needed unless otherwise documented below in the visit note. 

## 2017-11-20 NOTE — Patient Instructions (Signed)
GO TO THE LAB : Get the blood work     Divide Your next  Shingrix is in 2 or 3 months, please make a "nurse appointment" to get it.  Schedule your next appointment for a   physical exam 1 year from now

## 2017-11-23 ENCOUNTER — Other Ambulatory Visit: Payer: Self-pay | Admitting: Family Medicine

## 2017-11-23 NOTE — Assessment & Plan Note (Signed)
HTN: Continue Bystolic, RF as needed Hyperlipidemia: On Pravachol, check labs  + FH CAD: Continue ASA and CV RF control, to see cardiology soon, states that likely they will do a calcium coronary score. RTC 1 year

## 2017-11-27 ENCOUNTER — Other Ambulatory Visit: Payer: Self-pay

## 2017-11-27 MED ORDER — PRAVASTATIN SODIUM 40 MG PO TABS: 40.0000 mg | ORAL_TABLET | Freq: Every day | ORAL | 3 refills | Status: DC

## 2017-12-02 ENCOUNTER — Other Ambulatory Visit: Payer: Self-pay | Admitting: Internal Medicine

## 2017-12-08 ENCOUNTER — Other Ambulatory Visit: Payer: Self-pay | Admitting: Internal Medicine

## 2017-12-09 ENCOUNTER — Other Ambulatory Visit: Payer: Self-pay | Admitting: Internal Medicine

## 2017-12-10 NOTE — Telephone Encounter (Signed)
Please call patient, his last cholesterol needs improvement, I would recommend Lipitor 40 mg daily and labs in 6 weeks.  If she prefers to discuss with her cardiology, then refill Pravachol x 6 months.

## 2017-12-10 NOTE — Telephone Encounter (Signed)
Refill request for pravastatin- result notes from 11/20/2017 recommended changing med?

## 2017-12-11 NOTE — Telephone Encounter (Signed)
LMOM informing Pt to return call.  

## 2018-02-17 ENCOUNTER — Ambulatory Visit (INDEPENDENT_AMBULATORY_CARE_PROVIDER_SITE_OTHER): Payer: 59

## 2018-02-17 DIAGNOSIS — Z23 Encounter for immunization: Secondary | ICD-10-CM | POA: Diagnosis not present

## 2018-02-17 NOTE — Progress Notes (Signed)
Patient is here today for 2nd shingles injection. VIS given. 0.5 mL shingrix given in right deltoid IM. Patient tolerated well.

## 2018-02-18 DIAGNOSIS — D225 Melanocytic nevi of trunk: Secondary | ICD-10-CM | POA: Diagnosis not present

## 2018-02-18 DIAGNOSIS — L821 Other seborrheic keratosis: Secondary | ICD-10-CM | POA: Diagnosis not present

## 2018-02-18 DIAGNOSIS — L814 Other melanin hyperpigmentation: Secondary | ICD-10-CM | POA: Diagnosis not present

## 2018-02-19 DIAGNOSIS — I10 Essential (primary) hypertension: Secondary | ICD-10-CM | POA: Diagnosis not present

## 2018-02-19 DIAGNOSIS — E785 Hyperlipidemia, unspecified: Secondary | ICD-10-CM | POA: Diagnosis not present

## 2018-09-14 ENCOUNTER — Other Ambulatory Visit: Payer: Self-pay | Admitting: Internal Medicine

## 2018-09-29 ENCOUNTER — Ambulatory Visit (INDEPENDENT_AMBULATORY_CARE_PROVIDER_SITE_OTHER): Payer: 59

## 2018-09-29 ENCOUNTER — Other Ambulatory Visit: Payer: Self-pay

## 2018-09-29 DIAGNOSIS — Z23 Encounter for immunization: Secondary | ICD-10-CM

## 2018-10-07 ENCOUNTER — Other Ambulatory Visit: Payer: Self-pay | Admitting: Internal Medicine

## 2018-10-07 DIAGNOSIS — Z1231 Encounter for screening mammogram for malignant neoplasm of breast: Secondary | ICD-10-CM

## 2018-10-09 ENCOUNTER — Other Ambulatory Visit: Payer: Self-pay

## 2018-10-09 ENCOUNTER — Ambulatory Visit
Admission: RE | Admit: 2018-10-09 | Discharge: 2018-10-09 | Disposition: A | Discharge status: Home / Self Care | Payer: 59 | Source: Ambulatory Visit | Attending: Internal Medicine | Admitting: Internal Medicine

## 2018-10-09 DIAGNOSIS — Z1231 Encounter for screening mammogram for malignant neoplasm of breast: Secondary | ICD-10-CM

## 2018-11-09 ENCOUNTER — Other Ambulatory Visit: Payer: Self-pay | Admitting: Internal Medicine

## 2018-11-12 ENCOUNTER — Encounter: Payer: Self-pay | Admitting: Internal Medicine

## 2018-11-13 ENCOUNTER — Encounter: Payer: Self-pay | Admitting: Internal Medicine

## 2018-11-25 LAB — NOVEL CORONAVIRUS, NAA: SARS-CoV-2, NAA: NEGATIVE

## 2018-11-26 ENCOUNTER — Other Ambulatory Visit: Payer: Self-pay

## 2018-11-27 ENCOUNTER — Encounter: Payer: Self-pay | Admitting: Internal Medicine

## 2018-11-27 ENCOUNTER — Ambulatory Visit (INDEPENDENT_AMBULATORY_CARE_PROVIDER_SITE_OTHER): Payer: 59 | Admitting: Internal Medicine

## 2018-11-27 ENCOUNTER — Other Ambulatory Visit: Payer: Self-pay

## 2018-11-27 VITALS — BP 114/76 | Wt 162.0 lb

## 2018-11-27 DIAGNOSIS — Z0000 Encounter for general adult medical examination without abnormal findings: Secondary | ICD-10-CM | POA: Diagnosis not present

## 2018-11-27 DIAGNOSIS — E785 Hyperlipidemia, unspecified: Secondary | ICD-10-CM

## 2018-11-27 NOTE — Progress Notes (Signed)
Subjective:    Patient ID: Cindy Mooney, female    DOB: June 13, 1965, 53 y.o.   MRN: [id number]  DOS:  11/27/2018 Type of visit - description: Virtual Visit via Video Note  I connected with@   by a video enabled telemedicine application and verified that I am speaking with the correct person using two identifiers.   THIS ENCOUNTER IS A VIRTUAL VISIT DUE TO COVID-19 - PATIENT WAS NOT SEEN IN THE OFFICE. PATIENT HAS CONSENTED TO VIRTUAL VISIT / TELEMEDICINE VISIT   Location of patient: home  Location of provider: office  I discussed the limitations of evaluation and management by telemedicine and the availability of in person appointments. The patient expressed understanding and agreed to proceed.  History of Present Illness: CPX Has no concerns, she actually feels great, remains active, exercising well.   Review of Systems  A 14 point review of systems is negative    Past Medical History:  Diagnosis Date  . HTN (hypertension) 2009  . Hyperlipidemia 2009  . Migraines    weather related     Past Surgical History:  Procedure Laterality Date  . CARPAL TUNNEL RELEASE Bilateral 2006  . [place]    Social History   Socioeconomic History  . Marital status: Married    Spouse name: Not on file  . Number of children: 2  . Years of education: Not on file  . Highest education level: Not on file  Occupational History  . Occupation: Administrator at [employer]  . Financial resource strain: Not on file  . Food insecurity    Worry: Not on file    Inability: Not on file  . Transportation needs    Medical: Not on file    Non-medical: Not on file  Tobacco Use  . Smoking status: Never Smoker  . Smokeless tobacco: Never Used  Substance and Sexual Activity  . Alcohol use: Yes    Alcohol/week: 4.0 standard drinks    Types: 4 drink(s) per week  . Drug use: No  . Sexual activity: Not on file  Lifestyle  . Physical activity     Days per week: Not on file    Minutes per session: Not on file  . Stress: Not on file  Relationships  . Social Herbalist on phone: Not on file    Gets together: Not on file    Attends religious service: Not on file    Active member of club or organization: Not on file    Attends meetings of clubs or organizations: Not on file    Relationship status: Not on file  . Intimate partner violence    Fear of current or ex partner: Not on file    Emotionally abused: Not on file    Physically abused: Not on file    Forced sexual activity: Not on file  Other Topics Concern  . Not on file  Social History Narrative   Lives w/ husband   2 daughters   1  in [place], 1 in [place] (Therapist, sports ) w/  2 Gkids   Original from [place]      Family History  Problem Relation Age of Onset  . Breast cancer Other        aunt , dx in her 61  . Prostate cancer Father   . CAD Mother        M had a MI ,  first in her mid 56s, heavy ETOH-Tobacco  . Hyperlipidemia Mother   . Hypertension Mother   . Lung cancer Brother   . Colon cancer Neg Hx   . Diabetes Neg Hx      Allergies as of 11/27/2018      Reactions   Tetracyclines & Related    Hives and nausea      Medication List       Accurate as of November 27, 2018 11:59 PM. If you have any questions, ask your nurse or doctor.        aspirin 81 MG tablet Take 81 mg by mouth daily.   MULTIVITAMIN PO Take 1 tablet by mouth daily.   nebivolol 10 MG tablet Commonly known as: Bystolic Take 1 tablet (10 mg total) by mouth daily.   pravastatin 40 MG tablet Commonly known as: PRAVACHOL Take 1 tablet (40 mg total) by mouth daily.           Objective:   Physical Exam BP 114/76   Wt 162 lb (73.5 kg)   BMI 26.96 kg/m  This is a virtual video visit, she is alert oriented x3, in no distress.    Assessment     Assessment HTN Hyperlipidemia Migraines OAB Sees derm q 6-12 months  FH CAD- sees cards    PLAN: HTN: On  Bystolic, RF as needed, BPs are very good Hyperlipidemia: Last LDL elevated, she reports today she was not taking Pravachol regularly but now she is, checking labs Sees dermatology at least yearly + FH CAD: On aspirin and controlling CV RF. RTC 1 year    I discussed the assessment and treatment plan with the patient. The patient was provided an opportunity to ask questions and all were answered. The patient agreed with the plan and demonstrated an understanding of the instructions.   The patient was advised to call back or seek an in-person evaluation if the symptoms worsen or if the condition fails to improve as anticipated.

## 2018-11-27 NOTE — Assessment & Plan Note (Addendum)
-  Td:2015.  - Had a flu shot.   - s/p Shingrix x 2  -CCS, had a cscope 10-2013, next cscope scheduled for 12/2018 -Female care per  Gyn, mammogram recently - Labs: CMP, FLP, CBC -Doing very well with lifestyle.  Praised

## 2018-11-28 NOTE — Assessment & Plan Note (Signed)
HTN: On Bystolic, RF as needed, BPs are very good Hyperlipidemia: Last LDL elevated, she reports today she was not taking Pravachol regularly but now she is, checking labs Sees dermatology at least yearly + FH CAD: On aspirin and controlling CV RF. RTC 1 year

## 2018-12-09 ENCOUNTER — Other Ambulatory Visit: Payer: Self-pay

## 2018-12-09 ENCOUNTER — Ambulatory Visit (AMBULATORY_SURGERY_CENTER): Payer: 59 | Admitting: *Deleted

## 2018-12-09 ENCOUNTER — Encounter: Payer: Self-pay | Admitting: *Deleted

## 2018-12-09 ENCOUNTER — Encounter: Payer: Self-pay | Admitting: Internal Medicine

## 2018-12-09 VITALS — Temp 96.4°F | Ht 65.0 in | Wt 170.8 lb

## 2018-12-09 DIAGNOSIS — Z86010 Personal history of colonic polyps: Secondary | ICD-10-CM

## 2018-12-09 DIAGNOSIS — Z1159 Encounter for screening for other viral diseases: Secondary | ICD-10-CM

## 2018-12-09 MED ORDER — NA SULFATE-K SULFATE-MG SULF 17.5-3.13-1.6 GM/177ML PO SOLN: 1.0000 | Freq: Once | ORAL | 0 refills | Status: AC

## 2018-12-09 NOTE — Progress Notes (Signed)

## 2018-12-18 ENCOUNTER — Ambulatory Visit (INDEPENDENT_AMBULATORY_CARE_PROVIDER_SITE_OTHER): Payer: 59

## 2018-12-18 ENCOUNTER — Other Ambulatory Visit: Payer: Self-pay | Admitting: Internal Medicine

## 2018-12-18 DIAGNOSIS — Z1159 Encounter for screening for other viral diseases: Secondary | ICD-10-CM

## 2018-12-18 LAB — SARS CORONAVIRUS 2 (TAT 6-24 HRS): SARS Coronavirus 2: NEGATIVE

## 2018-12-21 ENCOUNTER — Other Ambulatory Visit: Payer: Self-pay

## 2018-12-21 ENCOUNTER — Other Ambulatory Visit (INDEPENDENT_AMBULATORY_CARE_PROVIDER_SITE_OTHER): Payer: 59

## 2018-12-21 DIAGNOSIS — E785 Hyperlipidemia, unspecified: Secondary | ICD-10-CM

## 2018-12-21 DIAGNOSIS — Z0000 Encounter for general adult medical examination without abnormal findings: Secondary | ICD-10-CM | POA: Diagnosis not present

## 2018-12-21 LAB — COMPREHENSIVE METABOLIC PANEL
ALT: 23 U/L (ref 0–35)
AST: 28 U/L (ref 0–37)
Albumin: 4.3 g/dL (ref 3.5–5.2)
Alkaline Phosphatase: 91 U/L (ref 39–117)
BUN: 15 mg/dL (ref 6–23)
CO2: 29 mEq/L (ref 19–32)
Calcium: 9.7 mg/dL (ref 8.4–10.5)
Chloride: 100 mEq/L (ref 96–112)
Creatinine, Ser: 0.72 mg/dL (ref 0.40–1.20)
GFR: 83.95 mL/min (ref >60.00)
Glucose, Bld: 91 mg/dL (ref 70–99)
Potassium: 5 mEq/L (ref 3.5–5.1)
Sodium: 135 mEq/L (ref 135–145)
Total Bilirubin: 0.6 mg/dL (ref 0.2–1.2)
Total Protein: 6.9 g/dL (ref 6.0–8.3)

## 2018-12-21 LAB — LIPID PANEL
Cholesterol: 225 mg/dL — ABNORMAL HIGH (ref 0–200)
HDL: 90.5 mg/dL (ref >39.00)
LDL Cholesterol: 118 mg/dL — ABNORMAL HIGH (ref 0–99)
NonHDL: 134.74
Total CHOL/HDL Ratio: 2
Triglycerides: 86 mg/dL (ref 0.0–149.0)
VLDL: 17.2 mg/dL (ref 0.0–40.0)

## 2018-12-21 LAB — CBC WITH DIFFERENTIAL/PLATELET
Basophils Absolute: 0.1 10*3/uL (ref 0.0–0.1)
Basophils Relative: 1.6 % (ref 0.0–3.0)
Eosinophils Absolute: 0.1 10*3/uL (ref 0.0–0.7)
Eosinophils Relative: 1.1 % (ref 0.0–5.0)
HCT: 42.9 % (ref 36.0–46.0)
Hemoglobin: 14 g/dL (ref 12.0–15.0)
Lymphocytes Relative: 41.9 % (ref 12.0–46.0)
Lymphs Abs: 2.4 10*3/uL (ref 0.7–4.0)
MCHC: 32.8 g/dL (ref 30.0–36.0)
MCV: 91.9 fl (ref 78.0–100.0)
Monocytes Absolute: 0.5 10*3/uL (ref 0.1–1.0)
Monocytes Relative: 9.3 % (ref 3.0–12.0)
Neutro Abs: 2.6 10*3/uL (ref 1.4–7.7)
Neutrophils Relative %: 46.1 % (ref 43.0–77.0)
Platelets: 284 10*3/uL (ref 150.0–400.0)
RBC: 4.66 Mil/uL (ref 3.87–5.11)
RDW: 13.4 % (ref 11.5–15.5)
WBC: 5.6 10*3/uL (ref 4.0–10.5)

## 2018-12-23 ENCOUNTER — Other Ambulatory Visit: Payer: Self-pay

## 2018-12-23 ENCOUNTER — Ambulatory Visit (AMBULATORY_SURGERY_CENTER): Payer: 59 | Admitting: Internal Medicine

## 2018-12-23 ENCOUNTER — Telehealth: Payer: Self-pay | Admitting: Internal Medicine

## 2018-12-23 ENCOUNTER — Encounter: Payer: Self-pay | Admitting: Internal Medicine

## 2018-12-23 VITALS — BP 137/73 | HR 50 | Resp 20 | Ht 65.0 in | Wt 170.8 lb

## 2018-12-23 DIAGNOSIS — Z86010 Personal history of colonic polyps: Secondary | ICD-10-CM | POA: Diagnosis not present

## 2018-12-23 DIAGNOSIS — E785 Hyperlipidemia, unspecified: Secondary | ICD-10-CM

## 2018-12-23 MED ORDER — SODIUM CHLORIDE 0.9 % IV SOLN: 500.0000 mL | Freq: Once | INTRAVENOUS | Status: DC

## 2018-12-23 NOTE — Progress Notes (Signed)
No path. 

## 2018-12-23 NOTE — Progress Notes (Signed)
Called to room to assist during endoscopic procedure.  Patient ID and intended procedure confirmed with present staff. Received instructions for my participation in the procedure from the performing physician.  

## 2018-12-23 NOTE — Telephone Encounter (Signed)
Called patient lvm to callback for lab appt in 4 months

## 2018-12-23 NOTE — Patient Instructions (Signed)
YOU HAD AN ENDOSCOPIC PROCEDURE TODAY AT THE [facility]:   Refer to the procedure report that was given to you for any specific questions about what was found during the examination.  If the procedure report does not answer your questions, please call your gastroenterologist to clarify.  If you requested that your care partner not be given the details of your procedure findings, then the procedure report has been included in a sealed envelope for you to review at your convenience later.  YOU SHOULD EXPECT: Some feelings of bloating in the abdomen. Passage of more gas than usual.  Walking can help get rid of the air that was put into your GI tract during the procedure and reduce the bloating. If you had a lower endoscopy (such as a colonoscopy or flexible sigmoidoscopy) you may notice spotting of blood in your stool or on the toilet paper. If you underwent a bowel prep for your procedure, you may not have a normal bowel movement for a few days.  Please Note:  You might notice some irritation and congestion in your nose or some drainage.  This is from the oxygen used during your procedure.  There is no need for concern and it should clear up in a day or so.  SYMPTOMS TO REPORT IMMEDIATELY:   Following lower endoscopy (colonoscopy or flexible sigmoidoscopy):  Excessive amounts of blood in the stool  Significant tenderness or worsening of abdominal pains  Swelling of the abdomen that is new, acute  Fever of 100F or higher  For urgent or emergent issues, a gastroenterologist can be reached at any hour by calling [phone].   DIET:  We do recommend a small meal at first, but then you may proceed to your regular diet.  Drink plenty of fluids but you should avoid alcoholic beverages for 24 hours.  ACTIVITY:  You should plan to take it easy for the rest of today and you should NOT DRIVE or use heavy machinery until tomorrow (because of the sedation medicines used during the test).     FOLLOW UP: Our staff will call the number listed on your records 48-72 hours following your procedure to check on you and address any questions or concerns that you may have regarding the information given to you following your procedure. If we do not reach you, we will leave a message.  We will attempt to reach you two times.  During this call, we will ask if you have developed any symptoms of COVID 19. If you develop any symptoms (ie: fever, flu-like symptoms, shortness of breath, cough etc.) before then, please call [phone].  If you test positive for Covid 19 in the 2 weeks post procedure, please call and report this information to us.    If any biopsies were taken you will be contacted by phone or by letter within the next 1-3 weeks.  Please call us at [phone] if you have not heard about the biopsies in 3 weeks.    SIGNATURES/CONFIDENTIALITY: You and/or your care partner have signed paperwork which will be entered into your electronic medical record.  These signatures attest to the fact that that the information above on your After Visit Summary has been reviewed and is understood.  Full responsibility of the confidentiality of this discharge information lies with you and/or your care-partner. 

## 2018-12-23 NOTE — Progress Notes (Signed)
Pt's states no medical or surgical changes since previsit or office visit.  Temp taken by LC VS taken by DT 

## 2018-12-23 NOTE — Op Note (Signed)
[facility] Patient Name: Cindy Mooney Procedure Date: 12/23/2018 9:22 AM MRN: [id number] Endoscopist: Docia Chuck. Henrene Pastor , MD Age: 53 Referring MD:  Date of Birth: 11-27-1965 Gender: Female Account #: [id number] Procedure:                Colonoscopy Indications:              High risk colon cancer surveillance: Personal                            history of non-advanced adenoma. prior exam 2015 Medicines:                Monitored Anesthesia Care Procedure:                Pre-Anesthesia Assessment:                           - Prior to the procedure, a History and Physical                            was performed, and patient medications and                            allergies were reviewed. The patient's tolerance of                            previous anesthesia was also reviewed. The risks                            and benefits of the procedure and the sedation                            options and risks were discussed with the patient.                            All questions were answered, and informed consent                            was obtained. Prior Anticoagulants: The patient has                            taken no previous anticoagulant or antiplatelet                            agents. ASA Grade Assessment: II - A patient with                            mild systemic disease. After reviewing the risks                            and benefits, the patient was deemed in                            satisfactory condition to undergo the procedure.  After obtaining informed consent, the colonoscope                            was passed under direct vision. Throughout the                            procedure, the patient's blood pressure, pulse, and                            oxygen saturations were monitored continuously. The                            Colonoscope was introduced through the anus and                            advanced to the  the cecum, identified by                            appendiceal orifice and ileocecal valve. The                            ileocecal valve, appendiceal orifice, and rectum                            were photographed. The quality of the bowel                            preparation was excellent. The colonoscopy was                            performed without difficulty. The patient tolerated                            the procedure well. The bowel preparation used was                            SUPREP via split dose instruction. Scope In: 9:32:02 AM Scope Out: 9:47:04 AM Scope Withdrawal Time: 0 hours 13 minutes 27 seconds  Total Procedure Duration: 0 hours 15 minutes 2 seconds  Findings:                 The entire examined colon appeared normal on direct                            and retroflexion views. Complications:            No immediate complications. Estimated blood loss:                            None. Estimated Blood Loss:     Estimated blood loss: none. Impression:               - The entire examined colon is normal on direct and  retroflexion views.                           - No specimens collected. Recommendation:           - Repeat colonoscopy in 10 years for surveillance.                           - Patient has a contact number available for                            emergencies. The signs and symptoms of potential                            delayed complications were discussed with the                            patient. Return to normal activities tomorrow.                            Written discharge instructions were provided to the                            patient.                           - Resume previous diet.                           - Continue present medications. Docia Chuck. Henrene Pastor, MD 12/23/2018 9:52:01 AM This report has been signed electronically.

## 2018-12-23 NOTE — Telephone Encounter (Signed)
-----   Message from Colon Branch, MD sent at 12/23/2018 12:04 PM EST ----- Regarding: labs Heather: Enter a order for FLP to be done in 4 monthsSherry: Please to schedule a lab appointment in 4 months

## 2018-12-23 NOTE — Progress Notes (Signed)
A/ox3, pleased with MAC, report to RN 

## 2018-12-25 ENCOUNTER — Telehealth: Payer: Self-pay

## 2018-12-25 ENCOUNTER — Telehealth: Payer: Self-pay | Admitting: *Deleted

## 2018-12-25 NOTE — Telephone Encounter (Signed)
Left message on follow up call. 

## 2018-12-25 NOTE — Telephone Encounter (Signed)
  Follow up Call-  Call back number 12/23/2018  Post procedure Call Back phone  # [phone]  Permission to leave phone message Yes  Some recent data might be hidden     Patient questions:  Message left to call us if necessary.  Second call.

## 2018-12-28 ENCOUNTER — Ambulatory Visit: Payer: 59 | Attending: Internal Medicine

## 2018-12-28 DIAGNOSIS — Z20822 Contact with and (suspected) exposure to covid-19: Secondary | ICD-10-CM

## 2018-12-29 LAB — NOVEL CORONAVIRUS, NAA: SARS-CoV-2, NAA: NOT DETECTED

## 2019-01-10 ENCOUNTER — Other Ambulatory Visit: Payer: Self-pay | Admitting: Internal Medicine

## 2019-01-29 LAB — NOVEL CORONAVIRUS, NAA: SARS-CoV-2, NAA: NOT DETECTED

## 2019-02-16 ENCOUNTER — Encounter: Payer: Self-pay | Admitting: Internal Medicine

## 2019-03-01 ENCOUNTER — Telehealth: Payer: Self-pay

## 2019-03-01 MED ORDER — METOPROLOL SUCCINATE ER 50 MG PO TB24: 50.0000 mg | ORAL_TABLET | Freq: Every day | ORAL | 1 refills | Status: DC

## 2019-03-01 NOTE — Telephone Encounter (Signed)
PA denied. Medication excluded from insurance plan.   Preferred alternatives:   Atenolol Bisoprolol Metoprolol Carvedilol

## 2019-03-01 NOTE — Telephone Encounter (Signed)
Mychart message sent, awaiting to see what Pt would like to do.

## 2019-03-01 NOTE — Telephone Encounter (Signed)
Advise patient about her insurance decision.   Recommend metoprolol 50 mg B.I.D.   If she strongly desires a 1 tablet daily, then metoprolol XR 100 mg 1 tablet daily.  Cost might be an issue.  Once she changes, needs to check BPs to be sure they remaining controlled.

## 2019-03-01 NOTE — Telephone Encounter (Signed)
PA initiated via Covermymeds; KEY: [id number]. Awaiting determination.

## 2019-03-02 ENCOUNTER — Other Ambulatory Visit: Payer: Self-pay | Admitting: Internal Medicine

## 2019-03-15 ENCOUNTER — Encounter: Payer: Self-pay | Admitting: Internal Medicine

## 2019-04-14 ENCOUNTER — Other Ambulatory Visit: Payer: Self-pay | Admitting: Internal Medicine

## 2019-04-15 ENCOUNTER — Other Ambulatory Visit: Payer: Self-pay

## 2019-04-15 MED ORDER — METOPROLOL SUCCINATE ER 50 MG PO TB24: 50.0000 mg | ORAL_TABLET | Freq: Every day | ORAL | 1 refills | Status: DC

## 2019-04-27 ENCOUNTER — Other Ambulatory Visit (INDEPENDENT_AMBULATORY_CARE_PROVIDER_SITE_OTHER): Payer: 59

## 2019-04-27 ENCOUNTER — Other Ambulatory Visit: Payer: Self-pay

## 2019-04-27 DIAGNOSIS — E785 Hyperlipidemia, unspecified: Secondary | ICD-10-CM | POA: Diagnosis not present

## 2019-04-27 LAB — LIPID PANEL
Cholesterol: 206 mg/dL — ABNORMAL HIGH (ref 0–200)
HDL: 68.9 mg/dL (ref >39.00)
NonHDL: 137.35
Total CHOL/HDL Ratio: 3
Triglycerides: 228 mg/dL — ABNORMAL HIGH (ref 0.0–149.0)
VLDL: 45.6 mg/dL — ABNORMAL HIGH (ref 0.0–40.0)

## 2019-04-27 LAB — LDL CHOLESTEROL, DIRECT: Direct LDL: 83 mg/dL

## 2019-07-13 ENCOUNTER — Other Ambulatory Visit: Payer: Self-pay | Admitting: Internal Medicine

## 2019-08-16 LAB — NOVEL CORONAVIRUS, NAA: SARS-CoV-2, NAA: DETECTED

## 2019-08-23 ENCOUNTER — Encounter: Payer: Self-pay | Admitting: Internal Medicine

## 2019-08-23 ENCOUNTER — Other Ambulatory Visit: Payer: Self-pay

## 2019-08-23 DIAGNOSIS — E785 Hyperlipidemia, unspecified: Secondary | ICD-10-CM

## 2019-08-23 DIAGNOSIS — I10 Essential (primary) hypertension: Secondary | ICD-10-CM

## 2019-09-06 ENCOUNTER — Other Ambulatory Visit: Payer: Self-pay | Admitting: Internal Medicine

## 2019-09-06 DIAGNOSIS — Z1231 Encounter for screening mammogram for malignant neoplasm of breast: Secondary | ICD-10-CM

## 2019-09-08 ENCOUNTER — Other Ambulatory Visit: Payer: 59

## 2019-09-08 NOTE — Addendum Note (Signed)
Addended by: Kelle Darting A on: 09/08/2019 01:30 PM   Modules accepted: Orders

## 2019-09-09 ENCOUNTER — Other Ambulatory Visit: Payer: Self-pay

## 2019-09-09 ENCOUNTER — Other Ambulatory Visit: Payer: 59

## 2019-09-09 DIAGNOSIS — E785 Hyperlipidemia, unspecified: Secondary | ICD-10-CM

## 2019-09-09 DIAGNOSIS — I10 Essential (primary) hypertension: Secondary | ICD-10-CM

## 2019-09-10 ENCOUNTER — Telehealth: Payer: Self-pay

## 2019-09-10 LAB — BASIC METABOLIC PANEL
BUN: 19 mg/dL (ref 7–25)
CO2: 29 mmol/L (ref 20–32)
Calcium: 9.8 mg/dL (ref 8.6–10.4)
Chloride: 98 mmol/L (ref 98–110)
Creat: 0.65 mg/dL (ref 0.50–1.05)
Glucose, Bld: 88 mg/dL (ref 65–99)
Potassium: 5.2 mmol/L (ref 3.5–5.3)
Sodium: 135 mmol/L (ref 135–146)

## 2019-09-10 LAB — LIPID PANEL
Cholesterol: 203 mg/dL — ABNORMAL HIGH (ref <200)
HDL: 86 mg/dL (ref >=50)
LDL Cholesterol (Calc): 99 mg/dL (calc)
Non-HDL Cholesterol (Calc): 117 mg/dL (calc) (ref <130)
Total CHOL/HDL Ratio: 2.4 (calc) (ref <5.0)
Triglycerides: 85 mg/dL (ref <150)

## 2019-09-10 LAB — HEMOGLOBIN A1C
Hgb A1c MFr Bld: 5.2 % of total Hgb (ref <5.7)
Mean Plasma Glucose: 103 (calc)
eAG (mmol/L): 5.7 (calc)

## 2019-09-10 NOTE — Telephone Encounter (Signed)
Physical form completed and faxed to [facility] at [phone]. Received fax confirmation. Form sent for scanning.

## 2019-10-11 ENCOUNTER — Other Ambulatory Visit: Payer: Self-pay

## 2019-10-11 ENCOUNTER — Ambulatory Visit
Admission: RE | Admit: 2019-10-11 | Discharge: 2019-10-11 | Disposition: A | Discharge status: Home / Self Care | Payer: 59 | Source: Ambulatory Visit | Attending: Internal Medicine | Admitting: Internal Medicine

## 2019-10-11 DIAGNOSIS — Z1231 Encounter for screening mammogram for malignant neoplasm of breast: Secondary | ICD-10-CM

## 2019-10-22 ENCOUNTER — Ambulatory Visit (INDEPENDENT_AMBULATORY_CARE_PROVIDER_SITE_OTHER): Payer: 59

## 2019-10-22 ENCOUNTER — Other Ambulatory Visit: Payer: Self-pay

## 2019-10-22 DIAGNOSIS — Z23 Encounter for immunization: Secondary | ICD-10-CM | POA: Diagnosis not present

## 2019-10-22 NOTE — Progress Notes (Signed)
Pre visit review using our clinic review tool, if applicable. No additional management support is needed unless otherwise documented below in the visit note.  Patient here for flu vaccine. 0.5mL flu vaccine given in left deltoid IM. Patient tolerated well. VIS given.   

## 2019-11-14 IMAGING — MG MM DIGITAL SCREENING BILAT W/ TOMO W/ CAD
8 series · 8 of 24 positions shown · non-contrast
Comparison: Previous exam(s).

CLINICAL DATA: Screening.

EXAM:
DIGITAL SCREENING BILATERAL MAMMOGRAM WITH TOMO AND CAD

[L MLO synth-2D]
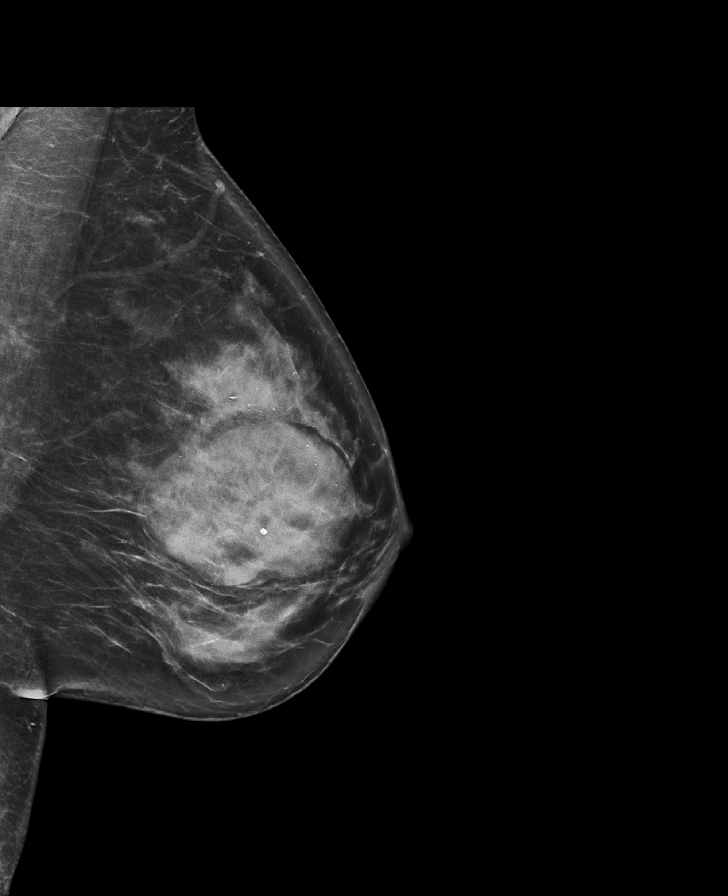

[R CC synth-2D]
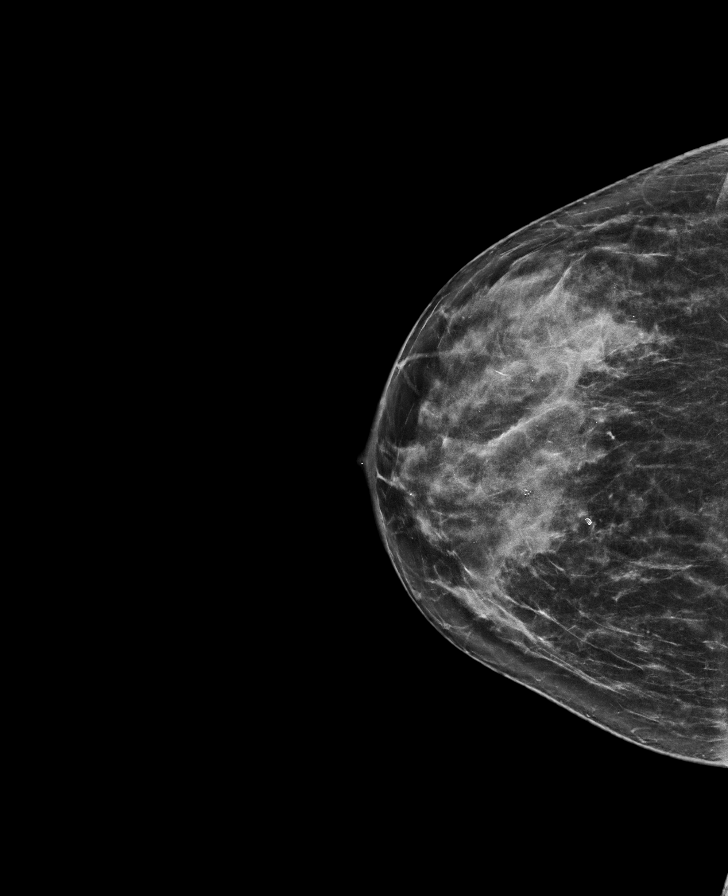

[R MLO synth-2D]
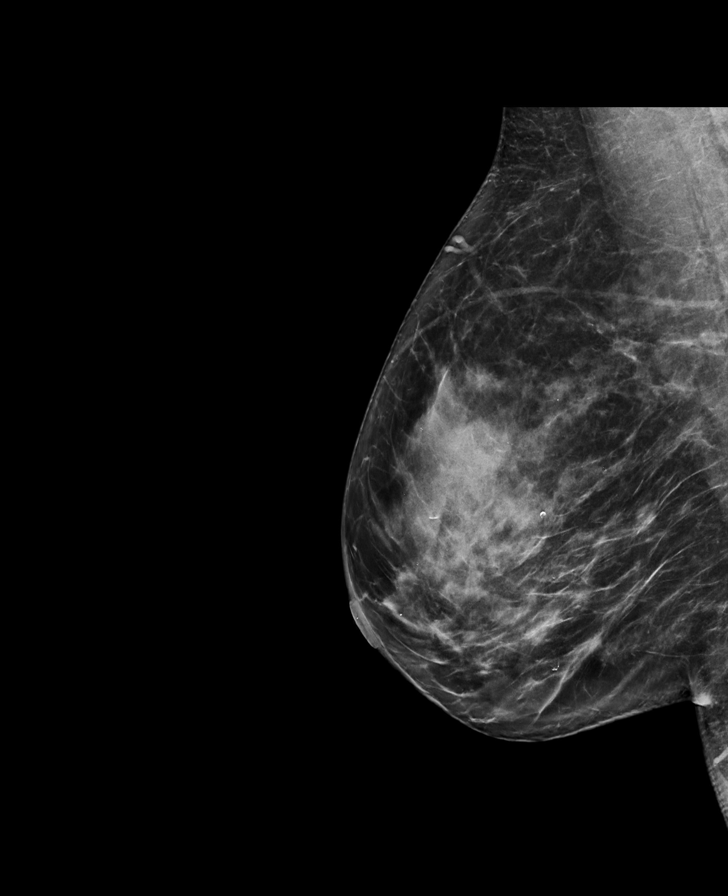

[L CC synth-2D]
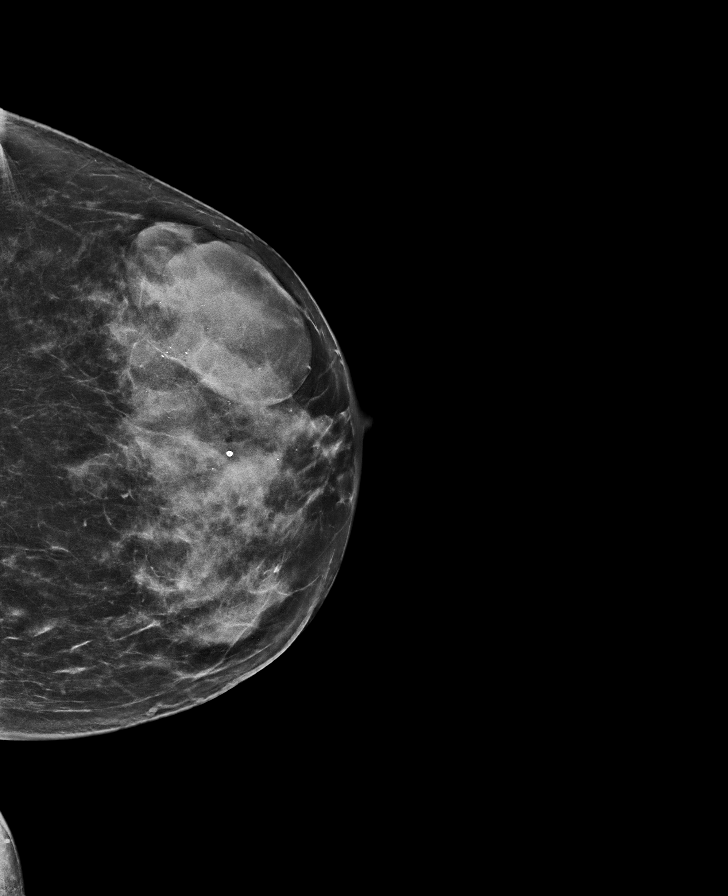

[R MLO tomo · tomo slice 39/78.0]
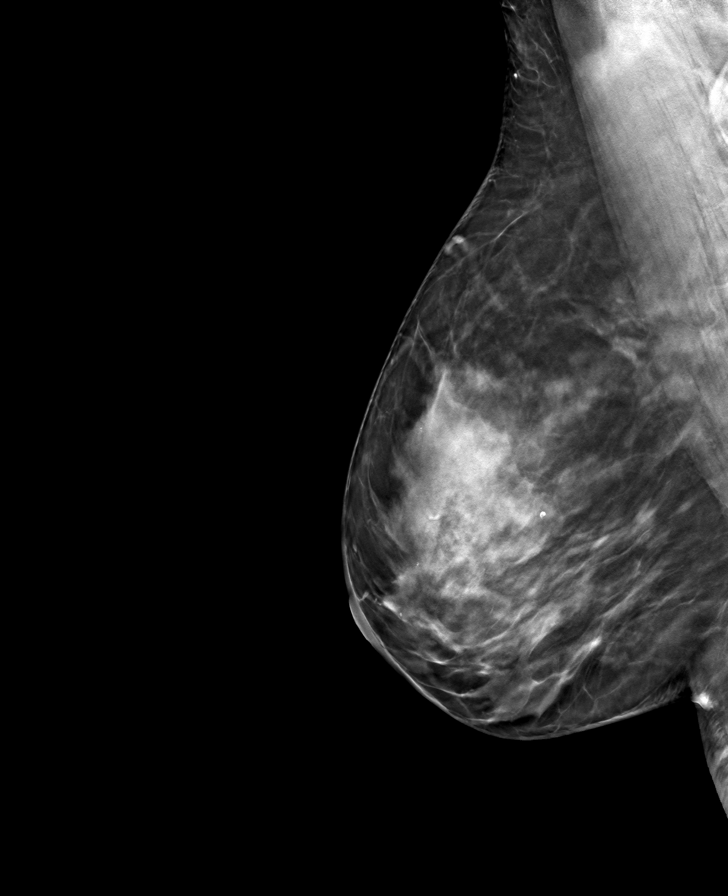

[R CC tomo · tomo slice 35/70.0]
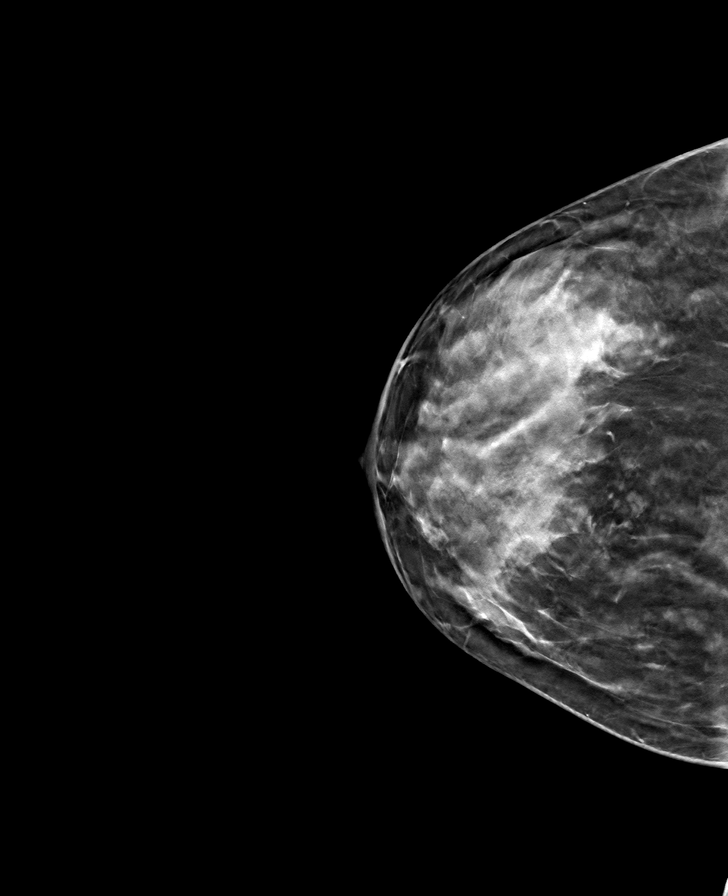

[L MLO tomo · tomo slice 40/79.0]
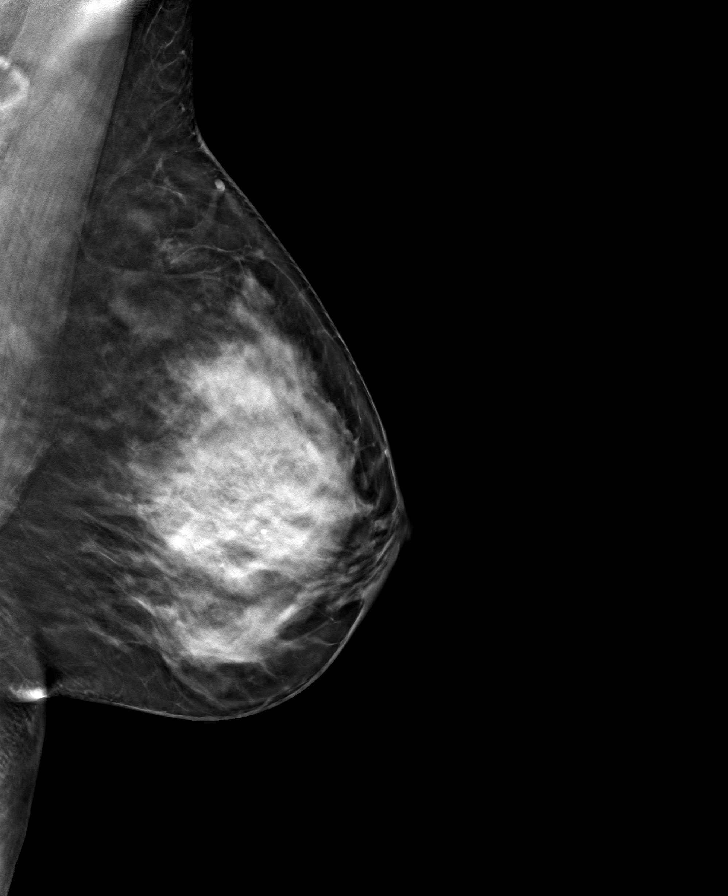

[L CC tomo · tomo slice 35/70.0]
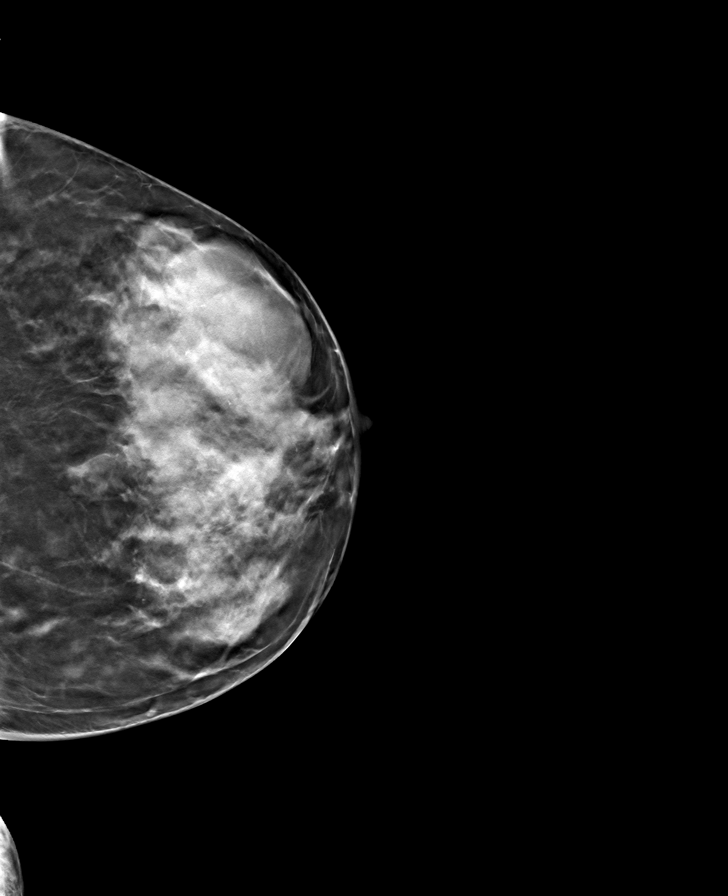

[8 of 24 positions shown; findings below may reference images not displayed]

ACR Breast Density Category c: The breast tissue is heterogeneously
dense, which may obscure small masses.
FINDINGS: There are no findings suspicious for malignancy. Images were
processed with CAD.
IMPRESSION: No mammographic evidence of malignancy. A result letter of this
screening mammogram will be mailed directly to the patient.

RECOMMENDATION:
Screening mammogram in one year. (Code:[id number]-[id number])

BI-RADS CATEGORY  1: Negative.

## 2019-11-17 ENCOUNTER — Other Ambulatory Visit: Payer: Self-pay | Admitting: Internal Medicine

## 2020-01-11 ENCOUNTER — Other Ambulatory Visit: Payer: Self-pay | Admitting: Internal Medicine

## 2020-01-12 ENCOUNTER — Encounter: Payer: 59 | Admitting: Internal Medicine

## 2020-01-13 ENCOUNTER — Encounter: Payer: Self-pay | Admitting: Internal Medicine

## 2020-01-13 ENCOUNTER — Other Ambulatory Visit: Payer: Self-pay | Admitting: Internal Medicine

## 2020-04-05 ENCOUNTER — Other Ambulatory Visit: Payer: Self-pay | Admitting: Internal Medicine

## 2020-04-21 ENCOUNTER — Encounter: Payer: 59 | Admitting: Internal Medicine

## 2020-04-24 ENCOUNTER — Other Ambulatory Visit: Payer: Self-pay

## 2020-04-24 ENCOUNTER — Telehealth: Payer: Self-pay

## 2020-04-24 ENCOUNTER — Encounter: Payer: Self-pay | Admitting: Internal Medicine

## 2020-04-24 ENCOUNTER — Ambulatory Visit (INDEPENDENT_AMBULATORY_CARE_PROVIDER_SITE_OTHER): Payer: 59 | Admitting: Internal Medicine

## 2020-04-24 VITALS — BP 147/85 | HR 49 | Temp 98.0°F | Resp 16 | Ht 65.0 in | Wt 163.0 lb

## 2020-04-24 DIAGNOSIS — Z0000 Encounter for general adult medical examination without abnormal findings: Secondary | ICD-10-CM | POA: Diagnosis not present

## 2020-04-24 DIAGNOSIS — I10 Essential (primary) hypertension: Secondary | ICD-10-CM

## 2020-04-24 DIAGNOSIS — E785 Hyperlipidemia, unspecified: Secondary | ICD-10-CM | POA: Diagnosis not present

## 2020-04-24 LAB — LIPID PANEL
Cholesterol: 189 mg/dL (ref 0–200)
HDL: 90.2 mg/dL (ref >39.00)
LDL Cholesterol: 82 mg/dL (ref 0–99)
NonHDL: 99.23
Total CHOL/HDL Ratio: 2
Triglycerides: 84 mg/dL (ref 0.0–149.0)
VLDL: 16.8 mg/dL (ref 0.0–40.0)

## 2020-04-24 LAB — COMPREHENSIVE METABOLIC PANEL
ALT: 20 U/L (ref 0–35)
AST: 22 U/L (ref 0–37)
Albumin: 3.9 g/dL (ref 3.5–5.2)
Alkaline Phosphatase: 83 U/L (ref 39–117)
BUN: 9 mg/dL (ref 6–23)
CO2: 29 mEq/L (ref 19–32)
Calcium: 9.5 mg/dL (ref 8.4–10.5)
Chloride: 102 mEq/L (ref 96–112)
Creatinine, Ser: 0.64 mg/dL (ref 0.40–1.20)
GFR: 98.5 mL/min (ref >60.00)
Glucose, Bld: 86 mg/dL (ref 70–99)
Potassium: 4.9 mEq/L (ref 3.5–5.1)
Sodium: 136 mEq/L (ref 135–145)
Total Bilirubin: 0.5 mg/dL (ref 0.2–1.2)
Total Protein: 6.5 g/dL (ref 6.0–8.3)

## 2020-04-24 LAB — CBC WITH DIFFERENTIAL/PLATELET
Basophils Absolute: 0.1 10*3/uL (ref 0.0–0.1)
Basophils Relative: 1.9 % (ref 0.0–3.0)
Eosinophils Absolute: 0 10*3/uL (ref 0.0–0.7)
Eosinophils Relative: 0.9 % (ref 0.0–5.0)
HCT: 40.9 % (ref 36.0–46.0)
Hemoglobin: 13.6 g/dL (ref 12.0–15.0)
Lymphocytes Relative: 51.5 % — ABNORMAL HIGH (ref 12.0–46.0)
Lymphs Abs: 2.5 10*3/uL (ref 0.7–4.0)
MCHC: 33.4 g/dL (ref 30.0–36.0)
MCV: 92.4 fl (ref 78.0–100.0)
Monocytes Absolute: 0.5 10*3/uL (ref 0.1–1.0)
Monocytes Relative: 10.7 % (ref 3.0–12.0)
Neutro Abs: 1.7 10*3/uL (ref 1.4–7.7)
Neutrophils Relative %: 35 % — ABNORMAL LOW (ref 43.0–77.0)
Platelets: 247 10*3/uL (ref 150.0–400.0)
RBC: 4.42 Mil/uL (ref 3.87–5.11)
RDW: 13 % (ref 11.5–15.5)
WBC: 4.8 10*3/uL (ref 4.0–10.5)

## 2020-04-24 LAB — HEMOGLOBIN A1C: Hgb A1c MFr Bld: 5.4 % (ref 4.6–6.5)

## 2020-04-24 LAB — TSH: TSH: 2.51 u[IU]/mL (ref 0.35–4.50)

## 2020-04-24 NOTE — Patient Instructions (Signed)
Check the  blood pressure 2  times a month  BP GOAL is between 110/65 and  135/85. If it is consistently higher or lower, let me know     GO TO THE LAB : Get the blood work     [place], [place] Come back for  A physical exam in 1 year    RECCOMENDATIONS -Postmenopausal women:    1200mg  calcium and 800 IU (20 micrograms) vitamin D daily     "Living will", "[place] of attorney": Advanced care planning  (If you already have a living will or healthcare power of attorney, please bring the copy to be scanned in your chart.)  Advance care planning is a process that supports adults in  understanding and sharing their preferences regarding future medical care.   The patient's preferences are recorded in documents called Advance Directives.    Advanced directives are completed (and can be modified at any time) while the patient is in full mental capacity.   The documentation should be available at all times to the patient, the family and the healthcare providers.  Bring in a copy to be scanned in your chart is an excellent idea and is recommended   This legal documents direct treatment decision making and/or appoint a surrogate to make the decision if the patient is not capable to do so.    Advance directives can be documented in many types of formats,  documents have names such as:  Lliving will  Durable power of attorney for healthcare (healthcare proxy or healthcare power of attorney)  Combined directives  Physician orders for life-sustaining treatment    More information at:  [email or web address]

## 2020-04-24 NOTE — Progress Notes (Signed)
   Subjective:    Patient ID: Cindy Mooney, female    DOB: [birth year], 55 y.o.   MRN: [id number]  DOS:  04/24/2020 Type of visit - description: CPX  Since the last office visit more than a year ago she is doing okay. Has no major concerns. Has a very healthy lifestyle  Wt Readings from Last 3 Encounters:  04/24/20 163 lb (73.9 kg)  12/23/18 170 lb 12.8 oz (77.5 kg)  12/09/18 170 lb 12.8 oz (77.5 kg)     Review of Systems  A 14 point review of systems is negative    Past Medical History:  Diagnosis Date  . HTN (hypertension) 2009  . Hyperlipidemia 2009  . Migraines    weather related     Past Surgical History:  Procedure Laterality Date  . CARPAL TUNNEL RELEASE Bilateral 2006  . [place]    Allergies as of 04/24/2020      Reactions   Tetracyclines & Related    Hives and nausea      Medication List       Accurate as of April 24, 2020  9:25 AM. If you have any questions, ask your nurse or doctor.        aspirin 81 MG tablet Take 81 mg by mouth daily.   metoprolol succinate 50 MG 24 hr tablet Commonly known as: TOPROL-XL Take 1 tablet (50 mg total) by mouth daily. Take with or immediately following a meal   MULTIVITAMIN PO Take 1 tablet by mouth daily.   pravastatin 40 MG tablet Commonly known as: PRAVACHOL Take 1 tablet (40 mg total) by mouth daily.          Objective:   Physical Exam BP (!) 147/85 (BP Location: Right Arm, Patient Position: Sitting, Cuff Size: Small)   Pulse (!) 49   Temp 98 F (36.7 C) (Oral)   Resp 16   Ht 5\' 5"  (1.651 m)   Wt 163 lb (73.9 kg)   SpO2 99%   BMI 27.12 kg/m  General: Well developed, NAD, BMI noted Neck: No  thyromegaly  HEENT:  Normocephalic . Face symmetric, atraumatic Lungs:  CTA B Normal respiratory effort, no intercostal retractions, no accessory muscle use. Heart: RRR,  no murmur.  Abdomen:  Not distended, soft, non-tender. No rebound or rigidity.    Lower extremities: no pretibial edema bilaterally  Skin: Exposed areas without rash. Not pale. Not jaundice Neurologic:  alert & oriented X3.  Speech normal, gait appropriate for age and unassisted Strength symmetric and appropriate for age.  Psych: Cognition and judgment appear intact.  Cooperative with normal attention span and concentration.  Behavior appropriate. No anxious or depressed appearing.     Assessment    Assessment HTN Hyperlipidemia Migraines OAB Sees derm q 6-12 months  FH CAD- sees cards    PLAN: Here for CPX HTN: Currently on metoprolol, BP slightly elevated today, she has a very healthy lifestyle, recommend to check regularly, see AVS Hyperlipidemia: Continue Pravachol, checking labs FH CAD: Saw cardiology 03/28/2020, for management of CV RF. Felt to have well-controlled HTN and cholesterol RTC 1 year   This visit occurred during the SARS-CoV-2 public health emergency.  Safety protocols were in place, including screening questions prior to the visit, additional usage of staff PPE, and extensive cleaning of exam room while observing appropriate contact time as indicated for disinfecting solutions.

## 2020-04-24 NOTE — Telephone Encounter (Signed)
Physical form completed and faxed to [facility] at [phone]. Form sent for scanning.

## 2020-04-25 ENCOUNTER — Encounter: Payer: Self-pay | Admitting: Internal Medicine

## 2020-04-25 NOTE — Assessment & Plan Note (Signed)
-  Td:2015.  - s/p Shingrix x 2  -s/p covid vax x 3, rec to consider booster -CCS, cscope 10-2013 and  12/2018, next per GI -Female care per  Gyn, [facility] 10-2010 ([facility]) - bones: Never had a DEXA, no FH, menopausal since 2021, rec calcium, vitamin D, remain active.  Consider DEXA 5 years after menopause started. - Labs: CMP, FLP, CBC -Doing great with lifestyle  - Advance directives d/w pt

## 2020-04-25 NOTE — Assessment & Plan Note (Signed)
Here for CPX HTN: Currently on metoprolol, BP slightly elevated today, she has a very healthy lifestyle, recommend to check regularly, see AVS Hyperlipidemia: Continue Pravachol, checking labs FH CAD: Saw cardiology 03/28/2020, for management of CV RF. Felt to have well-controlled HTN and cholesterol RTC 1 year

## 2020-07-08 ENCOUNTER — Other Ambulatory Visit: Payer: Self-pay | Admitting: Internal Medicine

## 2020-09-22 ENCOUNTER — Other Ambulatory Visit: Payer: Self-pay | Admitting: Internal Medicine

## 2020-10-17 ENCOUNTER — Other Ambulatory Visit: Payer: Self-pay | Admitting: Internal Medicine

## 2020-10-17 DIAGNOSIS — Z1231 Encounter for screening mammogram for malignant neoplasm of breast: Secondary | ICD-10-CM

## 2020-11-14 ENCOUNTER — Ambulatory Visit
Admission: RE | Admit: 2020-11-14 | Discharge: 2020-11-14 | Disposition: A | Discharge status: Home / Self Care | Payer: 59 | Source: Ambulatory Visit | Attending: Internal Medicine | Admitting: Internal Medicine

## 2020-11-14 ENCOUNTER — Encounter: Payer: Self-pay | Admitting: Radiology

## 2020-11-14 DIAGNOSIS — Z1231 Encounter for screening mammogram for malignant neoplasm of breast: Secondary | ICD-10-CM

## 2021-03-16 ENCOUNTER — Other Ambulatory Visit: Payer: Self-pay | Admitting: Internal Medicine

## 2021-04-25 ENCOUNTER — Encounter: Payer: 59 | Admitting: Internal Medicine

## 2021-05-11 ENCOUNTER — Encounter: Payer: Self-pay | Admitting: Internal Medicine

## 2021-05-11 ENCOUNTER — Ambulatory Visit (INDEPENDENT_AMBULATORY_CARE_PROVIDER_SITE_OTHER): Payer: 59 | Admitting: Internal Medicine

## 2021-05-11 VITALS — BP 124/78 | HR 54 | Temp 97.9°F | Resp 16 | Ht 65.0 in | Wt 169.5 lb

## 2021-05-11 DIAGNOSIS — E785 Hyperlipidemia, unspecified: Secondary | ICD-10-CM | POA: Diagnosis not present

## 2021-05-11 DIAGNOSIS — Z0000 Encounter for general adult medical examination without abnormal findings: Secondary | ICD-10-CM | POA: Diagnosis not present

## 2021-05-11 DIAGNOSIS — I10 Essential (primary) hypertension: Secondary | ICD-10-CM

## 2021-05-11 LAB — CBC WITH DIFFERENTIAL/PLATELET
Basophils Absolute: 0.1 10*3/uL (ref 0.0–0.1)
Basophils Relative: 1 % (ref 0.0–3.0)
Eosinophils Absolute: 0 10*3/uL (ref 0.0–0.7)
Eosinophils Relative: 0.8 % (ref 0.0–5.0)
HCT: 40.5 % (ref 36.0–46.0)
Hemoglobin: 13.5 g/dL (ref 12.0–15.0)
Lymphocytes Relative: 49.6 % — ABNORMAL HIGH (ref 12.0–46.0)
Lymphs Abs: 3 10*3/uL (ref 0.7–4.0)
MCHC: 33.5 g/dL (ref 30.0–36.0)
MCV: 92.3 fl (ref 78.0–100.0)
Monocytes Absolute: 0.6 10*3/uL (ref 0.1–1.0)
Monocytes Relative: 9.3 % (ref 3.0–12.0)
Neutro Abs: 2.3 10*3/uL (ref 1.4–7.7)
Neutrophils Relative %: 39.3 % — ABNORMAL LOW (ref 43.0–77.0)
Platelets: 288 10*3/uL (ref 150.0–400.0)
RBC: 4.39 Mil/uL (ref 3.87–5.11)
RDW: 13.4 % (ref 11.5–15.5)
WBC: 6 10*3/uL (ref 4.0–10.5)

## 2021-05-11 LAB — LIPID PANEL
Cholesterol: 241 mg/dL — ABNORMAL HIGH (ref 0–200)
HDL: 101.2 mg/dL (ref >39.00)
LDL Cholesterol: 117 mg/dL — ABNORMAL HIGH (ref 0–99)
NonHDL: 139.41
Total CHOL/HDL Ratio: 2
Triglycerides: 113 mg/dL (ref 0.0–149.0)
VLDL: 22.6 mg/dL (ref 0.0–40.0)

## 2021-05-11 LAB — COMPREHENSIVE METABOLIC PANEL
ALT: 19 U/L (ref 0–35)
AST: 23 U/L (ref 0–37)
Albumin: 4.4 g/dL (ref 3.5–5.2)
Alkaline Phosphatase: 107 U/L (ref 39–117)
BUN: 13 mg/dL (ref 6–23)
CO2: 29 mEq/L (ref 19–32)
Calcium: 9.5 mg/dL (ref 8.4–10.5)
Chloride: 101 mEq/L (ref 96–112)
Creatinine, Ser: 0.64 mg/dL (ref 0.40–1.20)
GFR: 97.78 mL/min (ref >60.00)
Glucose, Bld: 80 mg/dL (ref 70–99)
Potassium: 4.3 mEq/L (ref 3.5–5.1)
Sodium: 138 mEq/L (ref 135–145)
Total Bilirubin: 0.7 mg/dL (ref 0.2–1.2)
Total Protein: 7 g/dL (ref 6.0–8.3)

## 2021-05-11 NOTE — Progress Notes (Signed)
? ?Subjective:  ? ? Patient ID: Cindy Mooney, female    DOB: [birth year], 56 y.o.   MRN: [id number] ? ?DOS:  05/11/2021 ?Type of visit - description: cpx ? ?Here for CPX. ?Doing well. ?Has a small lump at the toe.  Wonders if it is a wart.  No redness, no discharge.  Not painful ? ?Wt Readings from Last 3 Encounters:  ?05/11/21 169 lb 8 oz (76.9 kg)  ?04/24/20 163 lb (73.9 kg)  ?12/23/18 170 lb 12.8 oz (77.5 kg)  ? ? ?Review of Systems ? ?Other than above, a 14 point review of systems is negative  ? ?  ? ? ?Past Medical History:  ?Diagnosis Date  ? HTN (hypertension) 2009  ? Hyperlipidemia 2009  ? Migraines   ? weather related   ? ? ?Past Surgical History:  ?Procedure Laterality Date  ? CARPAL TUNNEL RELEASE Bilateral 2006  ? [place]  ? ?Social History  ? ?Socioeconomic History  ? Marital status: Married  ?  Spouse name: Not on file  ? Number of children: 2  ? Years of education: Not on file  ? Highest education level: Not on file  ?Occupational History  ? Occupation: Administrator at [employer]  ?Tobacco Use  ? Smoking status: Never  ? Smokeless tobacco: Never  ?Vaping Use  ? Vaping Use: Never used  ?Substance and Sexual Activity  ? Alcohol use: Yes  ?  Alcohol/week: 4.0 standard drinks  ?  Types: 4 drink(s) per week  ? Drug use: No  ? Sexual activity: Not on file  ?Other Topics Concern  ? Not on file  ?Social History Narrative  ? Lives w/ husband  ? 2 daughters  ? 1  in [place], 1 in [place] Investment banker, corporate ) w/  2 Gkids  ? Original from [place]   ? ?Social Determinants of Health  ? ?Financial Resource Strain: Not on file  ?Food Insecurity: Not on file  ?Transportation Needs: Not on file  ?Physical Activity: Not on file  ?Stress: Not on file  ?Social Connections: Not on file  ?Intimate Partner Violence: Not on file  ? ? ? ?Current Outpatient Medications  ?Medication Instructions  ? aspirin 81 mg, Daily  ? metoprolol succinate (TOPROL-XL) 50 MG 24 hr tablet TAKE 1 TABLET BY MOUTH  DAILY  ?  Multiple Vitamins-Minerals (MULTIVITAMIN PO) 1 tablet, Oral, Daily  ? pravastatin (PRAVACHOL) 40 MG tablet TAKE 1 TABLET BY MOUTH  DAILY  ? ? ?   ?Objective:  ? Physical Exam ?Musculoskeletal:  ?     Feet: ? ? ?BP 124/78 (BP Location: Left Arm, Patient Position: Sitting, Cuff Size: Small)   Pulse (!) 54   Temp 97.9 ?F (36.6 ?C) (Oral)   Resp 16   Ht '5\' 5"'$  (1.651 m)   Wt 169 lb 8 oz (76.9 kg)   LMP 10/08/2018   SpO2 97%   BMI 28.21 kg/m?  ?General: ?Well developed, NAD, BMI noted ?Neck: No  thyromegaly  ?HEENT:  ?Normocephalic . Face symmetric, atraumatic ?Lungs:  ?CTA B ?Normal respiratory effort, no intercostal retractions, no accessory muscle use. ?Heart: RRR,  no murmur.  ?Abdomen:  ?Not distended, soft, non-tender. No rebound or rigidity.   ?Lower extremities: no pretibial edema bilaterally  ?Skin: Exposed areas without rash. Not pale. Not jaundice ?Neurologic:  ?alert & oriented X3.  ?Speech normal, gait appropriate for age and unassisted ?Strength symmetric and appropriate for age.  ?Psych: ?  Cognition and judgment appear intact.  ?Cooperative with normal attention span and concentration.  ?Behavior appropriate. ?No anxious or depressed appearing. ? ?   ?Assessment   ? ? Assessment ?HTN ?Hyperlipidemia ?Migraines ?OAB ?Sees derm q 6-12 months  ?FH CAD- sees cards  ?L Breast lump per CT 03/2021 (chronic per pt) ? ?PLAN: ?Here for CPX ?HTN: BP is very good, continue metoprolol, checking labs ?High cholesterol: On Pravachol, Checking labs ?FH CAD: Per cardiology, CT cardiac score 04/04/2021 zero. ?Breast lump: This was described on the coronary CT few weeks ago, 4.5 cm round masslike at the left breast.  Patient  tells me she is aware of the report, she has a cyst in that area that has been drained multiple times and is not concerned about it. ?RTC 1 year ? ? ? ?

## 2021-05-11 NOTE — Patient Instructions (Addendum)
Consider a booster vaccine this summer ? ?Check the  blood pressure regularly ?BP GOAL is between 110/65 and  135/85. ?If it is consistently higher or lower, let me know ? ?  ? ? ?GO TO THE LAB : Get the blood work   ? ? ?[place], [place] ?Come back for in 1 year for a physical exam ? ? ?"Living will", "Health Care Power of attorney": Advanced care planning ? ?(If you already have a living will or healthcare power of attorney, please bring the copy to be scanned in your chart.) ? ?Advance care planning is a process that supports adults in  understanding and sharing their preferences regarding future medical care.  ? ?The patient's preferences are recorded in documents called Advance Directives.    ?Advanced directives are completed (and can be modified at any time) while the patient is in full mental capacity.  ? ?The documentation should be available at all times to the patient, the family and the healthcare providers.  ?Bring in a copy to be scanned in your chart is an excellent idea and is recommended  ? ?This legal documents direct treatment decision making and/or appoint a surrogate to make the decision if the patient is not capable to do so.  ? ? ?Advance directives can be documented in many types of formats,  documents have names such as:  ?Lliving will  ?Durable power of attorney for healthcare (healthcare proxy or healthcare power of attorney)  ?Combined directives  ?Physician orders for life-sustaining treatment  ?  ?More information at: ? [email or web address]  ?

## 2021-05-12 ENCOUNTER — Encounter: Payer: Self-pay | Admitting: Internal Medicine

## 2021-05-12 NOTE — Assessment & Plan Note (Signed)
-  Td:2015.  ?- s/p Shingrix x 2  ?- covid vax: booster is an option ?-CCS, cscope 10-2013 and  12/2018, next 10 years per colonoscopy report. ?-Female care per  Gyn, [facility]G 11-2020([facility]) ?- bones: Never had a DEXA, no FH, menopausal since 2021, rec calcium, vitamin D,  DEXA planned for 2026 ?- Labs: CMP, FLP, CBC ?-Doing great with lifestyle, has a second job which is very active ?- Advance directives recommended ?  ?

## 2021-05-12 NOTE — Assessment & Plan Note (Signed)
Here for CPX ?HTN: BP is very good, continue metoprolol, checking labs ?High cholesterol: On Pravachol, Checking labs ?FH CAD: Per cardiology, CT cardiac score 04/04/2021 zero. ?Breast lump: This was described on the coronary CT few weeks ago, 4.5 cm round masslike at the left breast.  Patient  tells me she is aware of the report, she has a cyst in that area that has been drained multiple times and is not concerned about it. ?RTC 1 year ?

## 2021-06-02 ENCOUNTER — Other Ambulatory Visit: Payer: Self-pay | Admitting: Internal Medicine

## 2021-10-01 ENCOUNTER — Other Ambulatory Visit: Payer: Self-pay | Admitting: Internal Medicine

## 2021-10-01 DIAGNOSIS — Z1231 Encounter for screening mammogram for malignant neoplasm of breast: Secondary | ICD-10-CM

## 2021-11-15 ENCOUNTER — Ambulatory Visit: Payer: 59

## 2021-11-19 ENCOUNTER — Telehealth: Payer: 59 | Admitting: Nurse Practitioner

## 2021-11-19 DIAGNOSIS — J069 Acute upper respiratory infection, unspecified: Secondary | ICD-10-CM | POA: Diagnosis not present

## 2021-11-19 MED ORDER — BENZONATATE 100 MG PO CAPS: 100.0000 mg | ORAL_CAPSULE | Freq: Three times a day (TID) | ORAL | 0 refills | Status: DC | PRN

## 2021-11-19 MED ORDER — FLUTICASONE PROPIONATE 50 MCG/ACT NA SUSP: 2.0000 | Freq: Every day | NASAL | 6 refills | Status: DC

## 2021-11-19 NOTE — Progress Notes (Signed)
E-Visit for Upper Respiratory Infection   We are sorry you are not feeling well.  Here is how we plan to help!  Based on what you have shared with me, it looks like you may have a viral upper respiratory infection.  Upper respiratory infections are caused by a large number of viruses; however, rhinovirus is the most common cause. COVID-19 can also cause similar symptoms, if you can consider taking a home COVID test.   Symptoms vary from person to person, with common symptoms including sore throat, cough, fatigue or lack of energy and feeling of general discomfort.  A low-grade fever of up to 100.4 may present, but is often uncommon.  Symptoms vary however, and are closely related to a person's age or underlying illnesses.  The most common symptoms associated with an upper respiratory infection are nasal discharge or congestion, cough, sneezing, headache and pressure in the ears and face.  These symptoms usually persist for about 3 to 10 days, but can last up to 2 weeks.  It is important to know that upper respiratory infections do not cause serious illness or complications in most cases.    Providers prescribe antibiotics to treat infections caused by bacteria. Antibiotics are very powerful in treating bacterial infections when they are used properly. To maintain their effectiveness, they should be used only when necessary. Overuse of antibiotics has resulted in the development of superbugs that are resistant to treatment!    After careful review of your answers, I would not recommend an antibiotic for your condition.  Antibiotics are not effective against viruses and therefore should not be used to treat them. Common examples of infections caused by viruses include colds and flu   Upper respiratory infections can be transmitted from person to person, with the most common method of transmission being a person's hands.  The virus is able to live on the skin and can infect other persons for up to 2 hours  after direct contact.  Also, these can be transmitted when someone coughs or sneezes; thus, it is important to cover the mouth to reduce this risk.  To keep the spread of the illness at [place], good hand hygiene is very important.  This is an infection that is most likely caused by a virus. There are no specific treatments other than to help you with the symptoms until the infection runs its course.  We are sorry you are not feeling well.  Here is how we plan to help!   For nasal congestion, you may use an oral decongestants such as Mucinex D or if you have glaucoma or high blood pressure use plain Mucinex.  Saline nasal spray or nasal drops can help and can safely be used as often as needed for congestion.  For your congestion, I have prescribed Fluticasone nasal spray one spray in each nostril twice a day  If you do not have a history of heart disease, hypertension, diabetes or thyroid disease, prostate/bladder issues or glaucoma, you may also use Sudafed to treat nasal congestion.  It is highly recommended that you consult with a pharmacist or your primary care physician to ensure this medication is safe for you to take.     If you have a cough, you may use cough suppressants such as Delsym and Robitussin.  If you have glaucoma or high blood pressure, you can also use Coricidin HBP.   For cough I have prescribed for you A prescription cough medication called Tessalon Perles 100 mg. You may  take 1-2 capsules every 8 hours as needed for cough  If you have a sore or scratchy throat, use a saltwater gargle-  to  teaspoon of salt dissolved in a 4-ounce to 8-ounce glass of warm water.  Gargle the solution for approximately 15-30 seconds and then spit.  It is important not to swallow the solution.  You can also use throat lozenges/cough drops and Chloraseptic spray to help with throat pain or discomfort.  Warm or cold liquids can also be helpful in relieving throat pain.  For headache, pain or general  discomfort, you can use Ibuprofen or Tylenol as directed.   Some authorities believe that zinc sprays or the use of Echinacea may shorten the course of your symptoms.   HOME CARE Only take medications as instructed by your medical team. Be sure to drink plenty of fluids. Water is fine as well as fruit juices, sodas and electrolyte beverages. You may want to stay away from caffeine or alcohol. If you are nauseated, try taking small sips of liquids. How do you know if you are getting enough fluid? Your urine should be a pale yellow or almost colorless. Get rest. Taking a steamy shower or using a humidifier may help nasal congestion and ease sore throat pain. You can place a towel over your head and breathe in the steam from hot water coming from a faucet. Using a saline nasal spray works much the same way. Cough drops, hard candies and sore throat lozenges may ease your cough. Avoid close contacts especially the very young and the elderly Cover your mouth if you cough or sneeze Always remember to wash your hands.   GET HELP RIGHT AWAY IF: You develop worsening fever. If your symptoms do not improve within 10 days You develop yellow or green discharge from your nose over 3 days. You have coughing fits You develop a severe head ache or visual changes. You develop shortness of breath, difficulty breathing or start having chest pain Your symptoms persist after you have completed your treatment plan  MAKE SURE YOU  Understand these instructions. Will watch your condition. Will get help right away if you are not doing well or get worse.  Thank you for choosing an e-visit.  Your e-visit answers were reviewed by a board certified advanced clinical practitioner to complete your personal care plan. Depending upon the condition, your plan could have included both over the counter or prescription medications.  Please review your pharmacy choice. Make sure the pharmacy is open so you can pick up  prescription now. If there is a problem, you may contact your provider through [employer] and have the prescription routed to another pharmacy.  Your safety is important to [place]. If you have drug allergies check your prescription carefully.   For the next 24 hours you can use MyChart to ask questions about today's visit, request a non-urgent call back, or ask for a work or school excuse. You will get an email in the next two days asking about your experience. I hope that your e-visit has been valuable and will speed your recovery.  Meds ordered this encounter  Medications   fluticasone (FLONASE) 50 MCG/ACT nasal spray    Sig: Place 2 sprays into both nostrils daily.    Dispense:  16 g    Refill:  6   benzonatate (TESSALON) 100 MG capsule    Sig: Take 1 capsule (100 mg total) by mouth 3 (three) times daily as needed.    Dispense:  30 capsule    Refill:  0     I spent approximately 5 minutes reviewing the patient's history, current symptoms and coordinating their plan of care today.

## 2021-11-26 ENCOUNTER — Telehealth: Payer: 59 | Admitting: Family Medicine

## 2021-11-26 DIAGNOSIS — J019 Acute sinusitis, unspecified: Secondary | ICD-10-CM | POA: Diagnosis not present

## 2021-11-26 DIAGNOSIS — B9689 Other specified bacterial agents as the cause of diseases classified elsewhere: Secondary | ICD-10-CM

## 2021-11-26 MED ORDER — AMOXICILLIN-POT CLAVULANATE 875-125 MG PO TABS: 1.0000 | ORAL_TABLET | Freq: Two times a day (BID) | ORAL | 0 refills | Status: AC

## 2021-11-26 NOTE — Progress Notes (Signed)

## 2021-12-20 ENCOUNTER — Telehealth: Payer: Self-pay | Admitting: Internal Medicine

## 2021-12-20 NOTE — Telephone Encounter (Signed)
Will need appt for work note.

## 2021-12-20 NOTE — Telephone Encounter (Signed)
Spoke to patient, she stated she will do a e-visit through [employer] for the note.

## 2021-12-20 NOTE — Telephone Encounter (Signed)
Patient states she tested positive for covid and does not need medication but does need a work note stating she has to stay home for 5 days. She would like for it to be sent to her mychart. Please advise.

## 2021-12-21 ENCOUNTER — Other Ambulatory Visit: Payer: Self-pay | Admitting: Internal Medicine

## 2022-01-10 ENCOUNTER — Ambulatory Visit
Admission: RE | Admit: 2022-01-10 | Discharge: 2022-01-10 | Disposition: A | Discharge status: Home / Self Care | Payer: 59 | Source: Ambulatory Visit | Attending: Internal Medicine | Admitting: Internal Medicine

## 2022-01-10 DIAGNOSIS — Z1231 Encounter for screening mammogram for malignant neoplasm of breast: Secondary | ICD-10-CM

## 2022-01-11 ENCOUNTER — Other Ambulatory Visit: Payer: Self-pay | Admitting: Internal Medicine

## 2022-01-11 ENCOUNTER — Telehealth: Payer: Self-pay | Admitting: Internal Medicine

## 2022-01-11 DIAGNOSIS — R928 Other abnormal and inconclusive findings on diagnostic imaging of breast: Secondary | ICD-10-CM

## 2022-01-11 NOTE — Telephone Encounter (Signed)
Patient called stating that per her last mammogram she needs to take a diagnostics mammogram/possible ultrasound and she wants to know how to proceed.  Please advise.

## 2022-01-11 NOTE — Telephone Encounter (Signed)
Mammogram completed yesterday, [facility] will be in contact with Pt to schedule further imaging.

## 2022-01-22 ENCOUNTER — Ambulatory Visit: Admission: RE | Admit: 2022-01-22 | Payer: 59 | Source: Ambulatory Visit

## 2022-01-22 ENCOUNTER — Ambulatory Visit
Admission: RE | Admit: 2022-01-22 | Discharge: 2022-01-22 | Disposition: A | Discharge status: Home / Self Care | Payer: 59 | Source: Ambulatory Visit | Attending: Internal Medicine | Admitting: Internal Medicine

## 2022-01-22 DIAGNOSIS — R928 Other abnormal and inconclusive findings on diagnostic imaging of breast: Secondary | ICD-10-CM

## 2022-05-07 ENCOUNTER — Other Ambulatory Visit: Payer: Self-pay | Admitting: Internal Medicine

## 2022-05-13 ENCOUNTER — Encounter: Payer: Self-pay | Admitting: Internal Medicine

## 2022-05-13 ENCOUNTER — Ambulatory Visit (INDEPENDENT_AMBULATORY_CARE_PROVIDER_SITE_OTHER): Payer: 59 | Admitting: Internal Medicine

## 2022-05-13 VITALS — BP 132/74 | HR 72 | Temp 97.9°F | Resp 16 | Ht 65.0 in | Wt 163.2 lb

## 2022-05-13 DIAGNOSIS — E785 Hyperlipidemia, unspecified: Secondary | ICD-10-CM | POA: Diagnosis not present

## 2022-05-13 DIAGNOSIS — I10 Essential (primary) hypertension: Secondary | ICD-10-CM

## 2022-05-13 DIAGNOSIS — Z0000 Encounter for general adult medical examination without abnormal findings: Secondary | ICD-10-CM | POA: Diagnosis not present

## 2022-05-13 NOTE — Assessment & Plan Note (Signed)
-  Td:2015.  - s/p Shingrix x 2  - covid vax: recommended - flu shot q fall rec -CCS, cscope 10-2013 and  12/2018, next 10 years per colonoscopy report. -Female care per  Gyn, MMG 01/2022 per pt no acute findings  - Bones: Never had a DEXA, no FH, menopausal since 2021, rec calcium, vitamin D,  DEXA planned for 2026 - Labs: CMP FLP CBC vitamin D -Doing well with diet, calorie counting.  Remains active, has 2 jobs, one of them is very physical. -Healthcare POA: See AVS

## 2022-05-13 NOTE — Assessment & Plan Note (Signed)
Here for CPX HTN: On metoprolol, ambulatory BPs in the 120s.  No change.  Labs. High cholesterol: On Pravachol, labs. FH, CAD: Asymptomatic.  Continue CV RF control. Social: Has 2 jobs RTC 1 year

## 2022-05-13 NOTE — Progress Notes (Signed)
Subjective:    Patient ID: Cindy Mooney, female    DOB: 15-Sep-1965, 57 y.o.   MRN: [id number]  DOS:  05/13/2022 Type of visit - description: cpx  Here for CPX. Doing well. Has no major concerns.  Wt Readings from Last 3 Encounters:  05/13/22 163 lb 4 oz (74 kg)  05/11/21 169 lb 8 oz (76.9 kg)  04/24/20 163 lb (73.9 kg)     Review of Systems  A 14 point review of systems is negative    Past Medical History:  Diagnosis Date   HTN (hypertension) 2009   Hyperlipidemia 2009   Migraines    weather related     Past Surgical History:  Procedure Laterality Date   CARPAL TUNNEL RELEASE Bilateral 2006   CESAREAN SECTION  1990   x1   TUBAL LIGATION  1991   Social History   Socioeconomic History   Marital status: Married    Spouse name: Not on file   Number of children: 2   Years of education: Not on file   Highest education level: Not on file  Occupational History   Occupation: Systems developer at [employer]  Tobacco Use   Smoking status: Never   Smokeless tobacco: Never  Vaping Use   Vaping Use: Never used  Substance and Sexual Activity   Alcohol use: Yes    Alcohol/week: 4.0 standard drinks of alcohol    Types: 4 drink(s) per week   Drug use: No   Sexual activity: Not on file  Other Topics Concern   Not on file  Social History Narrative   Lives w/ husband   2 daughters   1  in [place], 1 in [place] (Charity fundraiser )    3 Gkids   Original from [place]    Social Determinants of Corporate investment banker Strain: Not on [employer] Insecurity: Not on file  Transportation Needs: Not on file  Physical Activity: Not on file  Stress: Not on file  Social Connections: Not on file  Intimate Partner Violence: Not on file     Current Outpatient Medications  Medication Instructions   aspirin 81 mg, Daily   metoprolol succinate (TOPROL-XL) 50 mg, Oral, Daily   Multiple Vitamins-Minerals (MULTIVITAMIN PO) 1 tablet, Oral, Daily   pravastatin (PRAVACHOL) 40 mg, Oral, Daily        Objective:   Physical Exam BP 132/74   Pulse 72   Temp 97.9 F (36.6 C) (Oral)   Resp 16   Ht 5\' 5"  (1.651 m)   Wt 163 lb 4 oz (74 kg)   LMP 10/08/2018   SpO2 97%   BMI 27.17 kg/m  General: Well developed, NAD, BMI noted Neck: No  thyromegaly  HEENT:  Normocephalic . Face symmetric, atraumatic Lungs:  CTA B Normal respiratory effort, no intercostal retractions, no accessory muscle use. Heart: RRR,  no murmur.  Abdomen:  Not distended, soft, non-tender. No rebound or rigidity.   Lower extremities: no pretibial edema bilaterally  Skin: Exposed areas without rash. Not pale. Not jaundice Neurologic:  alert & oriented X3.  Speech normal, gait appropriate for age and unassisted Strength symmetric and appropriate for age.  Psych: Cognition and judgment appear intact.  Cooperative with normal attention span and concentration.  Behavior appropriate. No anxious or depressed appearing.     Assessment     Assessment HTN Hyperlipidemia Migraines OAB Sees derm q 6-12 months  FH CAD --sees cards  --Calcium coronary score on 03-2021: 0 L Breast lump  per CT 03/2021 (chronic per pt)  PLAN: Here for CPX HTN: On metoprolol, ambulatory BPs in the 120s.  No change.  Labs. High cholesterol: On Pravachol, labs. FH, CAD: Asymptomatic.  Continue CV RF control. Social: Has 2 jobs RTC 1 year

## 2022-05-13 NOTE — Patient Instructions (Addendum)
Vaccines I recommend: Covid booster Flu shot every fall  Continue checking your blood pressure periodically BP GOAL is between 110/65 and  135/85. If it is consistently higher or lower, let me know      GO TO THE LAB : Get the blood work     GO TO THE FRONT DESK, PLEASE SCHEDULE YOUR APPOINTMENTS Come back for physical exam in 1 year    "Health Care Power of attorney" ,  "Living will" (Advance care planning documents)  If you already have a living will or healthcare power of attorney, is recommended you bring the copy to be scanned in your chart.   The document will be available to all the doctors you see in the system.  Advance care planning is a process that supports adults in  understanding and sharing their preferences regarding future medical care.  The patient's preferences are recorded in documents called Advance Directives and the can be modified at any time while the patient is in full mental capacity.   If you don't have one, please consider create one.      More information at: [email or web address]

## 2022-05-14 LAB — CBC WITH DIFFERENTIAL/PLATELET
Basophils Absolute: 0.1 10*3/uL (ref 0.0–0.1)
Basophils Relative: 1 % (ref 0.0–3.0)
Eosinophils Absolute: 0 10*3/uL (ref 0.0–0.7)
Eosinophils Relative: 0.6 % (ref 0.0–5.0)
HCT: 41.8 % (ref 36.0–46.0)
Hemoglobin: 14.1 g/dL (ref 12.0–15.0)
Lymphocytes Relative: 28.8 % (ref 12.0–46.0)
Lymphs Abs: 2.1 10*3/uL (ref 0.7–4.0)
MCHC: 33.8 g/dL (ref 30.0–36.0)
MCV: 90.8 fl (ref 78.0–100.0)
Monocytes Absolute: 0.6 10*3/uL (ref 0.1–1.0)
Monocytes Relative: 8.5 % (ref 3.0–12.0)
Neutro Abs: 4.4 10*3/uL (ref 1.4–7.7)
Neutrophils Relative %: 61.1 % (ref 43.0–77.0)
Platelets: 301 10*3/uL (ref 150.0–400.0)
RBC: 4.6 Mil/uL (ref 3.87–5.11)
RDW: 13 % (ref 11.5–15.5)
WBC: 7.2 10*3/uL (ref 4.0–10.5)

## 2022-05-14 LAB — COMPREHENSIVE METABOLIC PANEL
ALT: 25 U/L (ref 0–35)
AST: 28 U/L (ref 0–37)
Albumin: 4.3 g/dL (ref 3.5–5.2)
Alkaline Phosphatase: 113 U/L (ref 39–117)
BUN: 16 mg/dL (ref 6–23)
CO2: 30 mEq/L (ref 19–32)
Calcium: 9.9 mg/dL (ref 8.4–10.5)
Chloride: 100 mEq/L (ref 96–112)
Creatinine, Ser: 0.65 mg/dL (ref 0.40–1.20)
GFR: 96.73 mL/min (ref >60.00)
Glucose, Bld: 87 mg/dL (ref 70–99)
Potassium: 5.2 mEq/L — ABNORMAL HIGH (ref 3.5–5.1)
Sodium: 137 mEq/L (ref 135–145)
Total Bilirubin: 0.6 mg/dL (ref 0.2–1.2)
Total Protein: 6.9 g/dL (ref 6.0–8.3)

## 2022-05-14 LAB — LIPID PANEL
Cholesterol: 194 mg/dL (ref 0–200)
HDL: 75 mg/dL (ref >39.00)
LDL Cholesterol: 87 mg/dL (ref 0–99)
NonHDL: 119.42
Total CHOL/HDL Ratio: 3
Triglycerides: 164 mg/dL — ABNORMAL HIGH (ref 0.0–149.0)
VLDL: 32.8 mg/dL (ref 0.0–40.0)

## 2022-05-14 LAB — VITAMIN D 25 HYDROXY (VIT D DEFICIENCY, FRACTURES): VITD: 37.19 ng/mL (ref 30.00–100.00)

## 2022-05-15 ENCOUNTER — Encounter: Payer: Self-pay | Admitting: Internal Medicine

## 2022-07-25 ENCOUNTER — Other Ambulatory Visit: Payer: Self-pay | Admitting: Internal Medicine

## 2022-10-16 ENCOUNTER — Other Ambulatory Visit: Payer: Self-pay | Admitting: Internal Medicine

## 2022-10-30 ENCOUNTER — Encounter: Payer: Self-pay | Admitting: Internal Medicine

## 2022-10-30 ENCOUNTER — Ambulatory Visit (INDEPENDENT_AMBULATORY_CARE_PROVIDER_SITE_OTHER): Payer: 59 | Admitting: Internal Medicine

## 2022-10-30 VITALS — BP 116/68 | HR 66 | Temp 98.1°F | Resp 16 | Ht 65.0 in | Wt 155.0 lb

## 2022-10-30 DIAGNOSIS — Z23 Encounter for immunization: Secondary | ICD-10-CM | POA: Diagnosis not present

## 2022-10-30 DIAGNOSIS — R21 Rash and other nonspecific skin eruption: Secondary | ICD-10-CM

## 2022-10-30 MED ORDER — BETAMETHASONE DIPROPIONATE AUG 0.05 % EX CREA: TOPICAL_CREAM | Freq: Two times a day (BID) | CUTANEOUS | 0 refills | Status: DC

## 2022-10-30 NOTE — Patient Instructions (Signed)
Apply the cream twice daily for a week.  For itching, take over-the-counter: Claritin 10 mg daily Or Allegra 60 mg twice daily  See your dermatologist next month as you are planning.  Call if not gradually better

## 2022-10-30 NOTE — Progress Notes (Signed)
   Subjective:    Patient ID: Cindy Mooney, female    DOB: [birth year], 57 y.o.   MRN: [id number]  DOS:  10/30/2022 Type of visit - description: acute  Rash at the left arm for 2 weeks. Initially red, still very pruritic. Does not recall any contact with plants. Never had blisters or pustules. No fever or chills. Using calamine.   Review of Systems See above   Past Medical History:  Diagnosis Date   HTN (hypertension) 2009   Hyperlipidemia 2009   Migraines    weather related     Past Surgical History:  Procedure Laterality Date   CARPAL TUNNEL RELEASE Bilateral 2006   CESAREAN SECTION  1990   x1   TUBAL LIGATION  1991    Current Outpatient Medications  Medication Instructions   aspirin 81 mg, Daily   metoprolol succinate (TOPROL-XL) 50 mg, Oral, Daily   Multiple Vitamins-Minerals (MULTIVITAMIN PO) 1 tablet, Oral, Daily   pravastatin (PRAVACHOL) 40 mg, Oral, Daily       Objective:   Physical Exam Skin:         Comments: Have several, minute, ~ 1mm  skin colored papules  BP 116/68   Pulse 66   Temp 98.1 F (36.7 C) (Oral)   Resp 16   Ht 5\' 5"  (1.651 m)   Wt 155 lb (70.3 kg)   LMP 10/08/2018   SpO2 99%   BMI 25.79 kg/m  General:   Well developed, NAD, BMI noted. HEENT:  Normocephalic . Face symmetric, atraumatic   Neurologic:  alert & oriented X3.  Speech normal, gait appropriate for age and unassisted Psych--  Cognition and judgment appear intact.  Cooperative with normal attention span and concentration.  Behavior appropriate. No anxious or depressed appearing.      Assessment     Assessment HTN Hyperlipidemia Migraines OAB Sees derm q 6-12 months  FH CAD --sees cards  --Calcium coronary score on 03-2021: 0 L Breast lump per CT 03/2021 (chronic per pt)  PLAN: Rash, Contact dermatitis?  Pilaris keratosis?. Plan: Topical steroids, Claritin or Allegra, call if not gradually better.  To see dermatology next month. Preventative  care: Flu shot provided, R shoulder.

## 2022-10-30 NOTE — Assessment & Plan Note (Signed)
Rash, Contact dermatitis?  Pilaris keratosis?. Plan: Topical steroids, Claritin or Allegra, call if not gradually better.  To see dermatology next month. Preventative care: Flu shot provided, R shoulder.

## 2022-11-06 ENCOUNTER — Encounter: Payer: Self-pay | Admitting: Internal Medicine

## 2023-01-24 ENCOUNTER — Ambulatory Visit
Admission: RE | Admit: 2023-01-24 | Discharge: 2023-01-24 | Disposition: A | Discharge status: Home / Self Care | Payer: 59 | Source: Ambulatory Visit | Attending: Internal Medicine | Admitting: Internal Medicine

## 2023-01-24 ENCOUNTER — Other Ambulatory Visit: Payer: Self-pay | Admitting: Internal Medicine

## 2023-01-24 DIAGNOSIS — Z0000 Encounter for general adult medical examination without abnormal findings: Secondary | ICD-10-CM

## 2023-01-30 ENCOUNTER — Other Ambulatory Visit (HOSPITAL_COMMUNITY)
Admission: RE | Admit: 2023-01-30 | Discharge: 2023-01-30 | Disposition: A | Discharge status: Home / Self Care | Payer: 59 | Source: Ambulatory Visit | Attending: Nurse Practitioner | Admitting: Nurse Practitioner

## 2023-01-30 DIAGNOSIS — Z124 Encounter for screening for malignant neoplasm of cervix: Secondary | ICD-10-CM | POA: Insufficient documentation

## 2023-02-05 LAB — CYTOLOGY - PAP
Comment: NEGATIVE
Diagnosis: NEGATIVE
Diagnosis: REACTIVE
High risk HPV: NEGATIVE

## 2023-02-12 ENCOUNTER — Telehealth: Payer: 59 | Admitting: Family Medicine

## 2023-02-12 ENCOUNTER — Encounter: Payer: Self-pay | Admitting: Family Medicine

## 2023-02-12 VITALS — Temp 100.0°F | Ht 65.0 in | Wt 165.0 lb

## 2023-02-12 DIAGNOSIS — J069 Acute upper respiratory infection, unspecified: Secondary | ICD-10-CM

## 2023-02-12 MED ORDER — FLUTICASONE PROPIONATE 50 MCG/ACT NA SUSP: 2.0000 | Freq: Every day | NASAL | 6 refills | Status: DC

## 2023-02-12 MED ORDER — PREDNISONE 20 MG PO TABS: 40.0000 mg | ORAL_TABLET | Freq: Every day | ORAL | 0 refills | Status: AC

## 2023-02-12 MED ORDER — BENZONATATE 200 MG PO CAPS: 200.0000 mg | ORAL_CAPSULE | Freq: Two times a day (BID) | ORAL | 0 refills | Status: DC | PRN

## 2023-02-12 NOTE — Patient Instructions (Signed)
 Likely Viral Upper Respiratory Infection  Continue supportive measures including rest, hydration, humidifier use, steam showers, warm compresses to sinuses, warm liquids with lemon and honey, and over-the-counter cough, cold, and analgesics as needed. If symptoms persist 8-10 days, become severe, or return after a few days of feeling better, then please follow-up for repeat evaluation to determine if antibiotics may be necessary.  Over the counter medications that may be helpful for symptoms:  Guaifenesin 1200 mg extended release tabs twice daily, with plenty of water For cough and congestion Brand name: Mucinex   Pseudoephedrine 30 mg, one or two tabs every 4 to 6 hours For sinus congestion Brand name: Sudafed You must get this from the pharmacy counter.  Oxymetazoline nasal spray each morning, one spray in each nostril, for NO MORE THAN 3 days  For nasal and sinus congestion Brand name: Afrin Saline nasal spray or Saline Nasal Irrigation (Netti Pot, etc) 3-5 times a day For nasal and sinus congestion Brand names: Ocean or AYR Fluticasone nasal spray OR Mometasone nasal spray OR Triamcinolone Acetonide nasal spray - follow directions on the packaging For nasal and sinus congestion Brand name: Flonase, Nasonex, Nasacort Warm salt water gargles  For sore throat Every few hours as needed Alternate ibuprofen 400-600 mg and acetaminophen 1000 mg every 6 hours For fever, body aches, headache Brand names: Motrin or Advil and Tylenol Dextromethorphan 12-hour cough version 30 mg every 12 hours  For cough Brand name: Delsym Stop all other cold medications for now (Nyquil, Dayquil, Tylenol Cold, Theraflu, etc) and other non-prescription cough/cold preparations. Many of these have the same ingredients listed above and could cause an overdose of medication.   Herbal treatments that have been shown to be helpful in some patients include: Vitamin C 1000 mg per day Zinc 100 mg per day Quercetin  25-500 mg twice a day Melatonin 5-10mg  at bedtime Honey Green Tea  General Instructions Allow your body to rest Drink PLENTY of fluids Typically, we are the most contagious 1-2 days before symptoms start through the first 2-3 days of most severe symptoms. Per CDC guidelines, you can return to school/work when symptoms have started to improve and you have been fever-free for 24 hours. However, recommend you continue extra precautions for the following 5 days (frequent hand hygiene, masking, covering coughs/sneezes, minimize exposure to immunocompromised individuals, etc).  If you develop severe shortness of breath, uncontrolled fevers, coughing up blood, confusion, chest pain, or signs of dehydration (such as significantly decreased urine amounts or dizziness with standing) please go to the nearest ER.

## 2023-02-12 NOTE — Progress Notes (Signed)
Started Monday pm   Sore throat  Sinus pain Headache Cough - productive   Mucinex pm  Dayquil    OTC test - Covid and Flu negative x 2

## 2023-02-12 NOTE — Progress Notes (Signed)
 Virtual Video Visit via MyChart Note  I connected with  Cindy Mooney on 02/12/23 at 10:40 AM EST by the video enabled telemedicine application for MyChart, and verified that I am speaking with the correct person using two identifiers.   I introduced myself as a Publishing Rights Manager with the practice. We discussed the limitations of evaluation and management by telemedicine and the availability of in person appointments. The patient expressed understanding and agreed to proceed.  Participating parties in this visit include: The patient and the nurse practitioner listed.  The patient is: At home I am: In the office - [facility]  Subjective:    CC: URI symptoms   HPI: Cindy Mooney is a 58 y.o. year old female presenting today via MyChart today for URI symptoms.     Discussed the use of AI scribe software for clinical note transcription with the patient, who gave verbal consent to proceed.  History of Present Illness   Cindy Mooney is a 58 year old female who presents with severe headache and sinus pressure.  She has been experiencing a severe headache and sinus pressure for the past three days. The headache is intense with significant facial pressure, particularly severe on both sides. No fever is reported, and she has not been in contact with anyone known to be sick recently.  She has a mostly dry cough producing occasional greenish sputum. She uses Mucinex to help with her symptoms, though she is unsure of its effectiveness. Afrin nasal spray is used, but only for three days as advised.              Past medical history, Surgical history, Family history not pertinant except as noted below, Social history, Allergies, and medications have been entered into the medical record, reviewed, and corrections made.   Review of Systems:  All review of systems negative except what is listed in the HPI   Objective:    General:   Speaking clearly in complete sentences. Absent shortness of breath noted.   Alert and oriented x3.   Normal judgment.  Absent acute distress.   Impression and Recommendations:    Problem List Items Addressed This Visit   None Visit Diagnoses       Viral URI with cough    -  Primary   Relevant Medications   predniSONE  (DELTASONE ) 20 MG tablet   benzonatate  (TESSALON ) 200 MG capsule   fluticasone  (FLONASE ) 50 MCG/ACT nasal spray      Severe headache and sinus pressure, productive cough, and no fever. Likely viral given the early course. Negative COVID/Flu tests. -Start Prednisone . Monitor blood pressure and heart rate due to potential side effects. -Continue Mucinex to help break up mucus. -Prescribe Tessalon  Perles to decrease coughing fits. -Prescribe Flonase  to reduce nasal inflammation and prevent sinus infection. -Check in by Friday morning (02/14/2023) for potential antibiotic prescription if no significant improvement or if new symptoms (fever) develop.        Follow-up if symptoms worsen or fail to improve.    I discussed the assessment and treatment plan with the patient. The patient was provided an opportunity to ask questions and all were answered. The patient agreed with the plan and demonstrated an understanding of the instructions.   The patient was advised to call back or seek an in-person evaluation if the symptoms worsen or if the condition fails to improve as anticipated.   Waddell KATHEE Mon, NP

## 2023-04-11 ENCOUNTER — Other Ambulatory Visit: Payer: Self-pay | Admitting: Internal Medicine

## 2023-05-14 ENCOUNTER — Ambulatory Visit: Payer: 59 | Admitting: Internal Medicine

## 2023-05-14 ENCOUNTER — Encounter: Payer: Self-pay | Admitting: Internal Medicine

## 2023-05-14 VITALS — BP 116/72 | HR 60 | Temp 98.2°F | Resp 16 | Ht 65.0 in | Wt 166.4 lb

## 2023-05-14 DIAGNOSIS — I10 Essential (primary) hypertension: Secondary | ICD-10-CM | POA: Diagnosis not present

## 2023-05-14 DIAGNOSIS — Z0000 Encounter for general adult medical examination without abnormal findings: Secondary | ICD-10-CM | POA: Diagnosis not present

## 2023-05-14 DIAGNOSIS — Z23 Encounter for immunization: Secondary | ICD-10-CM

## 2023-05-14 DIAGNOSIS — E785 Hyperlipidemia, unspecified: Secondary | ICD-10-CM

## 2023-05-14 LAB — CBC WITH DIFFERENTIAL/PLATELET
Basophils Absolute: 0.1 10*3/uL (ref 0.0–0.1)
Basophils Relative: 0.9 % (ref 0.0–3.0)
Eosinophils Absolute: 0.1 10*3/uL (ref 0.0–0.7)
Eosinophils Relative: 0.7 % (ref 0.0–5.0)
HCT: 43.3 % (ref 36.0–46.0)
Hemoglobin: 14.3 g/dL (ref 12.0–15.0)
Lymphocytes Relative: 28.8 % (ref 12.0–46.0)
Lymphs Abs: 2.3 10*3/uL (ref 0.7–4.0)
MCHC: 33.1 g/dL (ref 30.0–36.0)
MCV: 93.1 fl (ref 78.0–100.0)
Monocytes Absolute: 0.6 10*3/uL (ref 0.1–1.0)
Monocytes Relative: 7.2 % (ref 3.0–12.0)
Neutro Abs: 5 10*3/uL (ref 1.4–7.7)
Neutrophils Relative %: 62.4 % (ref 43.0–77.0)
Platelets: 322 10*3/uL (ref 150.0–400.0)
RBC: 4.66 Mil/uL (ref 3.87–5.11)
RDW: 13.2 % (ref 11.5–15.5)
WBC: 8 10*3/uL (ref 4.0–10.5)

## 2023-05-14 LAB — LIPID PANEL
Cholesterol: 253 mg/dL — ABNORMAL HIGH (ref 0–200)
HDL: 90.3 mg/dL (ref >39.00)
LDL Cholesterol: 141 mg/dL — ABNORMAL HIGH (ref 0–99)
NonHDL: 162.63
Total CHOL/HDL Ratio: 3
Triglycerides: 110 mg/dL (ref 0.0–149.0)
VLDL: 22 mg/dL (ref 0.0–40.0)

## 2023-05-14 LAB — COMPREHENSIVE METABOLIC PANEL WITH GFR
ALT: 35 U/L (ref 0–35)
AST: 34 U/L (ref 0–37)
Albumin: 4.6 g/dL (ref 3.5–5.2)
Alkaline Phosphatase: 119 U/L — ABNORMAL HIGH (ref 39–117)
BUN: 14 mg/dL (ref 6–23)
CO2: 29 meq/L (ref 19–32)
Calcium: 9.9 mg/dL (ref 8.4–10.5)
Chloride: 101 meq/L (ref 96–112)
Creatinine, Ser: 0.67 mg/dL (ref 0.40–1.20)
GFR: 95.36 mL/min (ref >60.00)
Glucose, Bld: 82 mg/dL (ref 70–99)
Potassium: 4.6 meq/L (ref 3.5–5.1)
Sodium: 139 meq/L (ref 135–145)
Total Bilirubin: 0.6 mg/dL (ref 0.2–1.2)
Total Protein: 7 g/dL (ref 6.0–8.3)

## 2023-05-14 LAB — TSH: TSH: 1.43 u[IU]/mL (ref 0.35–5.50)

## 2023-05-14 NOTE — Patient Instructions (Addendum)
 INSTRUCTIONS  FOR TODAY  Vaccines to consider Flu shot every fall   GO TO THE LAB : Get the blood work     Next office visit for a physical exam in 1 year.  Sooner if needed Please make an appointment before you leave today      "Health Care Power of attorney" (Also know as a  "Living will" or  Advance care planning documents)  If you already have a living will or healthcare power of attorney, is recommended you bring the copy to be scanned in your chart.   The document will be available to all the doctors you see in the system.  If you are over 60 y/o and don't have the document, please read:  Advance care planning is a process that supports adults in  understanding and sharing their preferences regarding future medical care.  The patient's preferences are recorded in documents called Advance Directives and the can be modified at any time while the patient is in full mental capacity.     More information at: [email or web address]

## 2023-05-14 NOTE — Progress Notes (Unsigned)
   Subjective:    Patient ID: Cindy Mooney, female    DOB: [birth year], 58 y.o.   MRN: [id number]  DOS:  05/14/2023 Type of visit - description: CPX  Here for CPX No concerns, states she feels great.  Review of Systems See above   Past Medical History:  Diagnosis Date   HTN (hypertension) 2009   Hyperlipidemia 2009   Migraines    weather related     Past Surgical History:  Procedure Laterality Date   CARPAL TUNNEL RELEASE Bilateral 2006   CESAREAN SECTION  1990   x1   TUBAL LIGATION  1991    Current Outpatient Medications  Medication Instructions   aspirin 81 mg, Daily   metoprolol  succinate (TOPROL -XL) 50 mg, Oral, Daily   Multiple Vitamins-Minerals (MULTIVITAMIN PO) 1 tablet, Daily   pravastatin  (PRAVACHOL ) 40 mg, Oral, Daily       Objective:   Physical Exam BP 116/72   Pulse 60   Temp 98.2 F (36.8 C) (Oral)   Resp 16   Ht 5\' 5"  (1.651 m)   Wt 166 lb 6 oz (75.5 kg)   LMP 10/08/2018   SpO2 97%   BMI 27.69 kg/m  General: Well developed, NAD, BMI noted Neck: No  thyromegaly  HEENT:  Normocephalic . Face symmetric, atraumatic Lungs:  CTA B Normal respiratory effort, no intercostal retractions, no accessory muscle use. Heart: RRR,  no murmur.  Abdomen:  Not distended, soft, non-tender. No rebound or rigidity.   Lower extremities: no pretibial edema bilaterally  Skin: Exposed areas without rash. Not pale. Not jaundice Neurologic:  alert & oriented X3.  Speech normal, gait appropriate for age and unassisted Strength symmetric and appropriate for age.  Psych: Cognition and judgment appear intact.  Cooperative with normal attention span and concentration.  Behavior appropriate. No anxious or depressed appearing.     Assessment   Assessment HTN Hyperlipidemia Migraines OAB Sees derm q 6-12 months  FH CAD --sees cards  --Calcium coronary score on 03-2021: 0 L Breast lump per CT 03/2021 (chronic per pt)  PLAN: Here for CPX -Td: Today -  s/p Shingrix  x 2  - had a covid booster 2024 - Vaccines I recommend:   flu shot every fall -CCS, cscope 10-2013 and  12/2018, next 10 years per colonoscopy report. -Female care: PAP 02/05/2023 ([facility]), MMG 01/2023 ([facility])  - Bones: Never had a DEXA, no FH, menopausal since 2021, rec calcium, vitamin D ,  DEXA planned for 2026 - Labs: CMP FLP CBC TSH  -Lifestyle-doing great, exercises 4 times a week.  Walks her dog. - Healthcare POA: See AVS  Other issues addressed today: HTN: BP is very good, on metoprolol .  Last K+ slightly elevated, rechecking today High cholesterol: On pravastatin , checking labs.  Saw cardiology 05/07/2023 for management of cardiovascular risk. RTC 1 year  ==== Rash, Contact dermatitis?  Pilaris keratosis?. Plan: Topical steroids, Claritin or Allegra, call if not gradually better.  To see dermatology next month. Preventative care: Flu shot provided, R shoulder.

## 2023-05-15 ENCOUNTER — Encounter: Payer: Self-pay | Admitting: Internal Medicine

## 2023-05-15 NOTE — Assessment & Plan Note (Signed)
 Here for CPX -Td: Today - s/p Shingrix  x 2  - had a covid booster 2024 - Vaccines I recommend:   flu shot every fall -CCS, cscope 10-2013 and  12/2018, next 10 years per colonoscopy report. -Female care: PAP 02/05/2023 ([facility]), MMG 01/2023 ([facility])  - Bones: Never had a DEXA, no FH, menopausal since 2021, rec calcium, vitamin D ,  DEXA planned for 2026 - Labs: CMP FLP CBC TSH -Lifestyle-doing great, exercises 4 times a week.  Walks her dog. - Healthcare POA: See AVS

## 2023-05-15 NOTE — Assessment & Plan Note (Signed)
 Here for CPX  Other issues addressed today: HTN: BP is very good, on metoprolol .  Last K+ slightly elevated, rechecking today High cholesterol: On pravastatin , checking labs. Saw cardiology 05/07/2023 for management of cardiovascular risk. RTC 1 year

## 2023-05-16 MED ORDER — ROSUVASTATIN CALCIUM 20 MG PO TABS: 20.0000 mg | ORAL_TABLET | Freq: Every day | ORAL | 0 refills | Status: DC

## 2023-05-16 NOTE — Addendum Note (Signed)
 Addended by: Ekin Pilar D on: 05/16/2023 03:52 PM   Modules accepted: Orders

## 2023-07-01 ENCOUNTER — Other Ambulatory Visit: Payer: Self-pay | Admitting: Internal Medicine

## 2023-08-20 ENCOUNTER — Telehealth: Payer: Self-pay

## 2023-08-20 DIAGNOSIS — C4492 Squamous cell carcinoma of skin, unspecified: Secondary | ICD-10-CM | POA: Insufficient documentation

## 2023-08-20 NOTE — Telephone Encounter (Signed)
 Initial Comment Caller states she is has had a headache since Thursday . The over counter medication is not working. Translation No Nurse Assessment Nurse: Kristy, RN, Adriana Date/Time (Eastern Time): 08/20/2023 12:21:45 PM Confirm and document reason for call. If symptomatic, describe symptoms. ---pt reports intermittent daily headaches. has had a headache everyday since last thursday. pt had a constant headache thur-monday. yesterday it was intermittent as well as today. current pain 3/10. had nausea before yesterday. OTCs are not helping. has tried multiple Does the patient have any new or worsening symptoms? ---Yes Will a triage be completed? ---Yes Related visit to physician within the last 2 weeks? ---No Does the PT have any chronic conditions? (i.e. diabetes, asthma, this includes High risk factors for pregnancy, etc.) ---Yes List chronic conditions. ---htn high cholesterol Is this a behavioral health or substance abuse call? ---No Guidelines Guideline Title Affirmed Question Affirmed Notes Nurse Date/Time (Eastern Time) Headache [1] MODERATE headache (e.g., interferes with normal activities) AND [2] present > 24 hours AND Kristy, RN, Adriana 08/20/2023 2:24:59 PM PLEASE NOTE: All timestamps contained within this report are represented as [place] Standard Time. CONFIDENTIALTY NOTICE: This fax transmission is intended only for the addressee. It contains information that is legally privileged, confidential or otherwise protected from use or disclosure. If you are not the intended recipient, you are strictly prohibited from reviewing, disclosing, copying using or disseminating any of this information or taking any action in reliance on or regarding this information. If you have received this fax in error, please notify us  immediately by telephone so that we can arrange for its return to us . Phone: [phone], Toll-Free: [phone], Fax:  [phone] DEBRA_SULLIVAN 11-08-1965 Page: 1 of2 CallId: [id number] Guidelines Guideline Title Affirmed Question Affirmed Notes Nurse Date/Time Titus Time) [3] unexplained (Exceptions: Pain medicines not tried, typical migraine, or headache part of viral illness.) Disp. Time Titus Time) Disposition Final User 08/20/2023 2:26:47 PM See PCP within 24 Hours Yes Kristy RN, Kathrine Final Disposition 08/20/2023 2:26:47 PM See PCP within 24 Hours Yes Kristy, RN, Kathrine Caller Disagree/Comply Comply Caller Understands Yes PreDisposition Call Doctor Care Advice Given Per Guideline SEE PCP WITHIN 24 HOURS: COLD PACK FOR HEADACHE: * Put a cold pack or an ice bag (wrapped in a moist towel) on the forehead. * Do this for 20 minutes. CALL BACK IF: * You become worse CARE ADVICE given per Headache (Adult) guideline. Referrals REFERRED TO PCP OFFICE

## 2023-08-20 NOTE — Telephone Encounter (Signed)
 Nurse triage would like Pt seen within 24 hours, can you see if she will be willing to see another provider? Thank you.

## 2023-08-22 ENCOUNTER — Ambulatory Visit (INDEPENDENT_AMBULATORY_CARE_PROVIDER_SITE_OTHER): Admitting: Internal Medicine

## 2023-08-22 VITALS — BP 126/72 | HR 72 | Temp 98.2°F | Resp 16 | Ht 65.0 in | Wt 169.5 lb

## 2023-08-22 DIAGNOSIS — G43009 Migraine without aura, not intractable, without status migrainosus: Secondary | ICD-10-CM | POA: Diagnosis not present

## 2023-08-22 DIAGNOSIS — G43909 Migraine, unspecified, not intractable, without status migrainosus: Secondary | ICD-10-CM | POA: Insufficient documentation

## 2023-08-22 MED ORDER — SUMATRIPTAN SUCCINATE 50 MG PO TABS: ORAL_TABLET | ORAL | 0 refills | Status: AC

## 2023-08-22 NOTE — Assessment & Plan Note (Signed)
 Migraine headache: Patient was Dx with migraine headaches at age 58, have them on and off, typically triggered by changes in the weather like we had in [place] the last 10 days. She feels the migraines have been classic, not the worst of her life. Recommend the following: Trial with Imitrex  50 mg 1 to 2 tablets with the onset of headache, okay to take 1 additional tablet. If she ever has different headache, she needs to seek medical attention. If headaches are becoming more frequently,  she will let me know.  Prophylactic meds?Cindy Mooney

## 2023-08-22 NOTE — Patient Instructions (Signed)
 If you feel the headache is coming:  Rest, drink plenty fluids Imitrex  50 mg: Take 1 or 2 tablets as soon as you can. In 2 hours you can take 1 additional tablet.  Never take more than 3 tablets in a 24-hour period.  If you have a different headache, intense headache, fever or chills or something different: Seek medical attention  If you have frequent headaches let me know

## 2023-08-22 NOTE — Progress Notes (Signed)
   Subjective:    Patient ID: Cindy Mooney, female    DOB: [birth year], 58 y.o.   MRN: [id number]  DOS:  08/22/2023 Type of visit - description: Acute  Has been having daily headache for the last 8 to 10 days. No headaches in the last 48 hours.  Headache is located on the top of the head and frequently radiates to the right eye, admits to some right eye tearing.    She feels this is a classic headache.  Eye tearing is one of the classic symptoms that she has with headaches.  Not the worst HA of her life.   Review of Systems See above   Past Medical History:  Diagnosis Date   HTN (hypertension) 01/08/2007   Hyperlipidemia 01/08/2007   Migraines    weather related    SCC (squamous cell carcinoma)     Past Surgical History:  Procedure Laterality Date   CARPAL TUNNEL RELEASE Bilateral 2006   CESAREAN SECTION  1990   x1   TUBAL LIGATION  1991    Current Outpatient Medications  Medication Instructions   aspirin 81 mg, Daily   metoprolol  succinate (TOPROL -XL) 50 mg, Oral, Daily, Take with or immediately following a meal   Multiple Vitamins-Minerals (MULTIVITAMIN PO) 1 tablet, Daily   rosuvastatin  (CRESTOR ) 20 mg, Oral, Daily at bedtime       Objective:   Physical Exam BP 126/72   Pulse 72   Temp 98.2 F (36.8 C) (Oral)   Resp 16   Ht 5' 5 (1.651 m)   Wt 169 lb 8 oz (76.9 kg)   LMP 10/08/2018   SpO2 97%   BMI 28.21 kg/m  General:   Well developed, NAD, BMI noted. HEENT:  Normocephalic . Face symmetric, atraumatic Lower extremities: no pretibial edema bilaterally  Skin: Not pale. Not jaundice Neurologic:  alert & oriented X3.  Speech normal, gait appropriate for age and unassisted Motor and DTR symmetric. EOMI, pupils equal and reactive Psych--  Cognition and judgment appear intact.  Cooperative with normal attention span and concentration.  Behavior appropriate. No anxious or depressed appearing.      Assessment      Assessment HTN Hyperlipidemia Migraines (see OV 08/22/2023) OAB Sees derm q 6-12 months  FH CAD --sees cards  --Calcium  coronary score on 03-2021: 0 L Breast lump per CT 03/2021 (chronic per pt)  PLAN: Migraine headache: Patient was Dx with migraine headaches at age 22, have them on and off, typically triggered by changes in the weather like we had in [place] the last 10 days. She feels the migraines have been classic, not the worst of her life. Recommend the following: Trial with Imitrex  50 mg 1 to 2 tablets with the onset of headache, okay to take 1 additional tablet. If she ever has different headache, she needs to seek medical attention. If headaches are becoming more frequently,  she will let me know.  Prophylactic meds?Cindy Mooney

## 2023-09-26 ENCOUNTER — Encounter: Payer: Self-pay | Admitting: Internal Medicine

## 2023-12-25 ENCOUNTER — Other Ambulatory Visit: Payer: Self-pay | Admitting: Internal Medicine

## 2023-12-25 DIAGNOSIS — Z1231 Encounter for screening mammogram for malignant neoplasm of breast: Secondary | ICD-10-CM

## 2023-12-26 ENCOUNTER — Other Ambulatory Visit

## 2023-12-26 DIAGNOSIS — E785 Hyperlipidemia, unspecified: Secondary | ICD-10-CM | POA: Diagnosis not present

## 2023-12-26 LAB — AST: AST: 28 U/L (ref 5–37)

## 2023-12-26 LAB — LIPID PANEL
Cholesterol: 186 mg/dL (ref 28–200)
HDL: 91.5 mg/dL
LDL Cholesterol: 71 mg/dL (ref 10–99)
NonHDL: 94.62
Total CHOL/HDL Ratio: 2
Triglycerides: 116 mg/dL (ref 10.0–149.0)
VLDL: 23.2 mg/dL (ref 0.0–40.0)

## 2023-12-26 LAB — ALT: ALT: 32 U/L (ref 3–35)

## 2023-12-28 ENCOUNTER — Ambulatory Visit: Payer: Self-pay | Admitting: Family

## 2024-01-20 ENCOUNTER — Other Ambulatory Visit: Payer: Self-pay | Admitting: Medical Genetics

## 2024-01-27 ENCOUNTER — Ambulatory Visit
Admission: RE | Admit: 2024-01-27 | Discharge: 2024-01-27 | Disposition: A | Source: Ambulatory Visit | Attending: Internal Medicine | Admitting: Internal Medicine

## 2024-01-27 DIAGNOSIS — Z1231 Encounter for screening mammogram for malignant neoplasm of breast: Secondary | ICD-10-CM

## 2024-05-14 ENCOUNTER — Encounter: Admitting: Internal Medicine
